# Patient Record
Sex: Female | Born: 1985 | Race: White | Hispanic: Yes | Marital: Single | Smoking: Former smoker
Health system: Southern US, Community
[De-identification: ages and names within clinical notes are randomized; demographics above are authoritative.]

## PROBLEM LIST (undated history)

## (undated) DIAGNOSIS — F319 Bipolar disorder, unspecified: Secondary | ICD-10-CM

## (undated) DIAGNOSIS — F419 Anxiety disorder, unspecified: Secondary | ICD-10-CM

---
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
## 2003-08-26 ENCOUNTER — Encounter: Admission: RE | Admit: 2003-08-26 | Discharge: 2003-08-26 | Payer: Self-pay | Admitting: Obstetrics and Gynecology

## 2003-12-23 ENCOUNTER — Encounter: Admission: RE | Admit: 2003-12-23 | Discharge: 2003-12-23 | Payer: Self-pay | Admitting: Obstetrics and Gynecology

## 2004-11-23 ENCOUNTER — Inpatient Hospital Stay (HOSPITAL_COMMUNITY): Admission: AD | Admit: 2004-11-23 | Discharge: 2004-11-23 | Payer: Self-pay | Admitting: Obstetrics & Gynecology

## 2005-08-07 ENCOUNTER — Ambulatory Visit: Payer: Self-pay | Admitting: Family Medicine

## 2005-08-30 ENCOUNTER — Ambulatory Visit: Payer: Self-pay | Admitting: Family Medicine

## 2005-08-30 ENCOUNTER — Encounter (INDEPENDENT_AMBULATORY_CARE_PROVIDER_SITE_OTHER): Payer: Self-pay | Admitting: Family Medicine

## 2006-04-25 ENCOUNTER — Ambulatory Visit: Payer: Self-pay | Admitting: Family Medicine

## 2006-12-12 ENCOUNTER — Encounter (INDEPENDENT_AMBULATORY_CARE_PROVIDER_SITE_OTHER): Payer: Self-pay | Admitting: Family Medicine

## 2006-12-12 ENCOUNTER — Ambulatory Visit: Payer: Self-pay | Admitting: Family Medicine

## 2007-04-02 ENCOUNTER — Encounter (INDEPENDENT_AMBULATORY_CARE_PROVIDER_SITE_OTHER): Payer: Self-pay | Admitting: *Deleted

## 2007-09-04 ENCOUNTER — Ambulatory Visit: Payer: Self-pay | Admitting: Family Medicine

## 2007-09-04 ENCOUNTER — Ambulatory Visit: Payer: Self-pay | Admitting: *Deleted

## 2007-09-04 LAB — CONVERTED CEMR LAB
Calcium: 9.8 mg/dL (ref 8.4–10.5)
Creatinine, Ser: 0.54 mg/dL (ref 0.40–1.20)
Free T4: 1.26 ng/dL (ref 0.89–1.80)
HCT: 42.1 % (ref 36.0–46.0)
MCHC: 31.8 g/dL (ref 30.0–36.0)
MCV: 94.8 fL (ref 78.0–100.0)
Monocytes Relative: 9 % (ref 3–12)
Neutro Abs: 2.4 10*3/uL (ref 1.7–7.7)
Neutrophils Relative %: 45 % (ref 43–77)
Potassium: 4.4 meq/L (ref 3.5–5.3)
RDW: 13.4 % (ref 11.5–15.5)
Sodium: 141 meq/L (ref 135–145)
TSH: 1.862 microintl units/mL (ref 0.350–5.50)

## 2007-12-15 ENCOUNTER — Ambulatory Visit: Payer: Self-pay | Admitting: Family Medicine

## 2007-12-15 ENCOUNTER — Encounter (INDEPENDENT_AMBULATORY_CARE_PROVIDER_SITE_OTHER): Payer: Self-pay | Admitting: Family Medicine

## 2007-12-15 LAB — CONVERTED CEMR LAB
Chlamydia, DNA Probe: NEGATIVE
GC Probe Amp, Genital: NEGATIVE

## 2008-03-19 ENCOUNTER — Ambulatory Visit: Payer: Self-pay | Admitting: Family Medicine

## 2008-04-26 ENCOUNTER — Ambulatory Visit: Payer: Self-pay | Admitting: Internal Medicine

## 2008-06-18 ENCOUNTER — Inpatient Hospital Stay (HOSPITAL_COMMUNITY): Admission: AD | Admit: 2008-06-18 | Discharge: 2008-06-19 | Payer: Self-pay | Admitting: Obstetrics & Gynecology

## 2008-08-20 ENCOUNTER — Inpatient Hospital Stay (HOSPITAL_COMMUNITY): Admission: AD | Admit: 2008-08-20 | Discharge: 2008-08-20 | Payer: Self-pay | Admitting: Obstetrics & Gynecology

## 2008-08-20 ENCOUNTER — Ambulatory Visit: Payer: Self-pay | Admitting: Physician Assistant

## 2008-09-16 ENCOUNTER — Ambulatory Visit (HOSPITAL_COMMUNITY): Admission: RE | Admit: 2008-09-16 | Discharge: 2008-09-16 | Payer: Self-pay | Admitting: Obstetrics & Gynecology

## 2008-11-09 ENCOUNTER — Ambulatory Visit: Payer: Self-pay | Admitting: Family Medicine

## 2008-11-09 ENCOUNTER — Inpatient Hospital Stay (HOSPITAL_COMMUNITY): Admission: AD | Admit: 2008-11-09 | Discharge: 2008-11-12 | Payer: Self-pay | Admitting: Obstetrics & Gynecology

## 2009-12-20 IMAGING — US US OB FOLLOW-UP
1 series · 14 of 28 positions shown · non-contrast
Comparison: none

OBSTETRICAL ULTRASOUND:
 This ultrasound exam was performed in the [HOSPITAL] Ultrasound Department.  The OB US report was generated in the AS system, and faxed to the ordering physician.  This report is also available in [REDACTED] PACS.

[Series 1: us ob follow up · 14 of 40 slices shown]
[im 2/40]
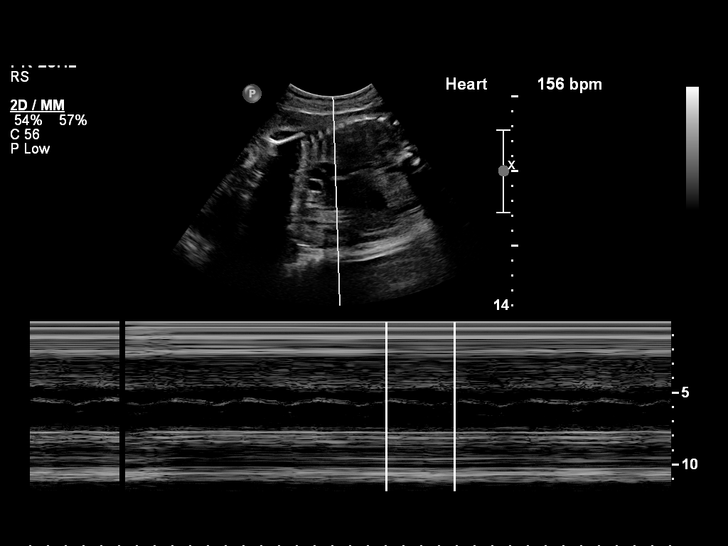
[im 5/40]
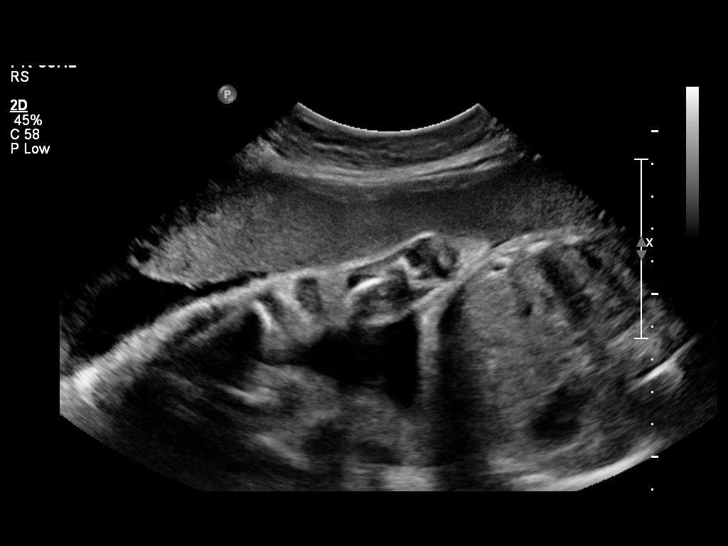
[im 8/40]
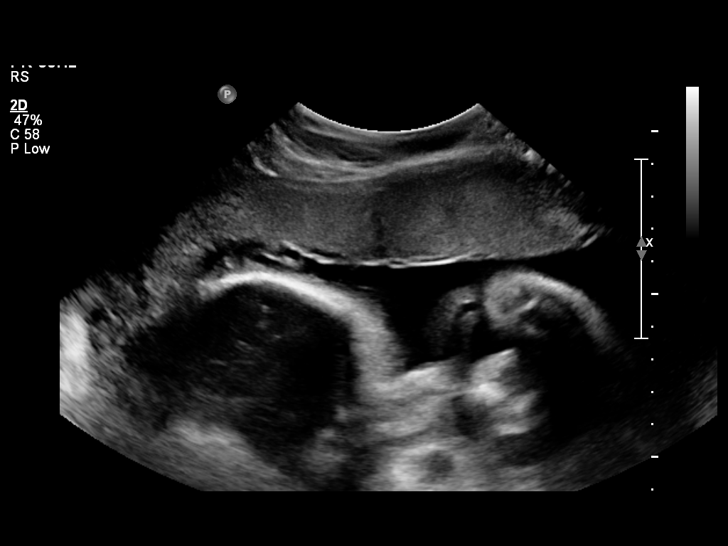
[im 11/40]
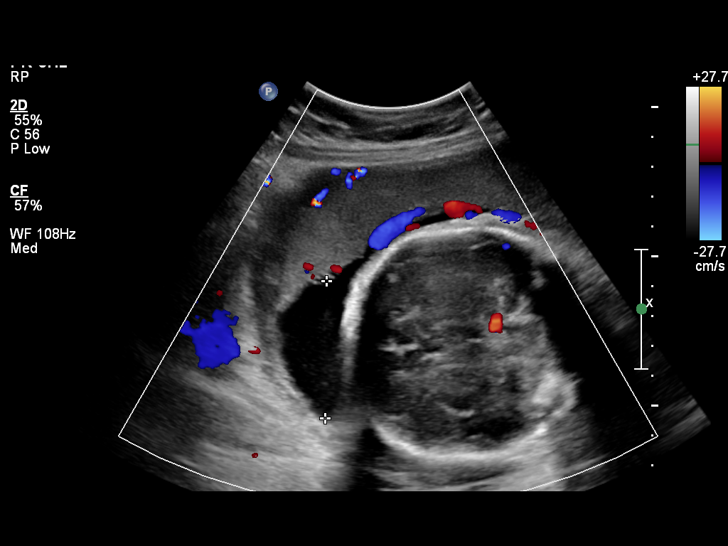
[im 14/40]
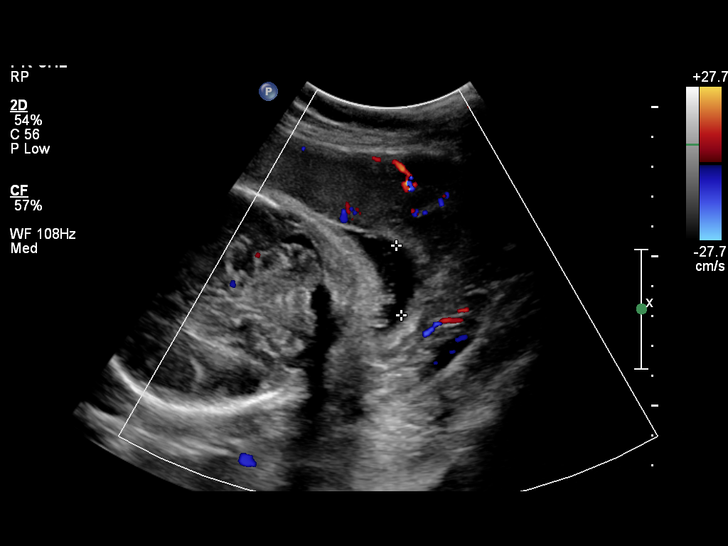
[im 16/40]
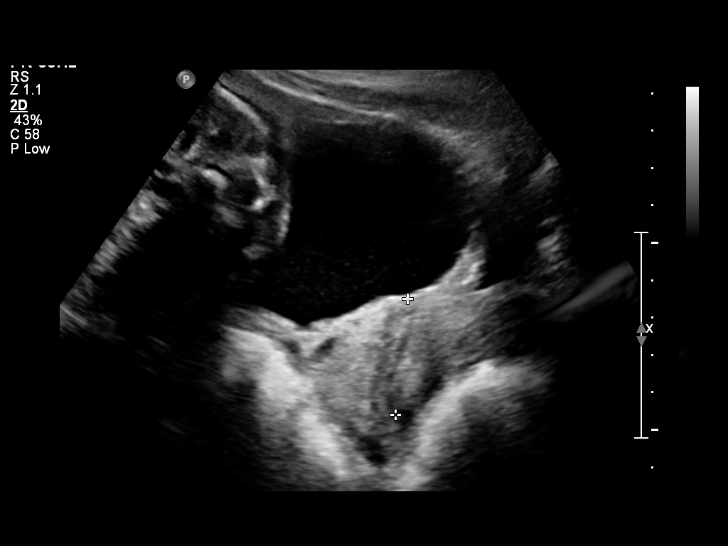
[im 19/40]
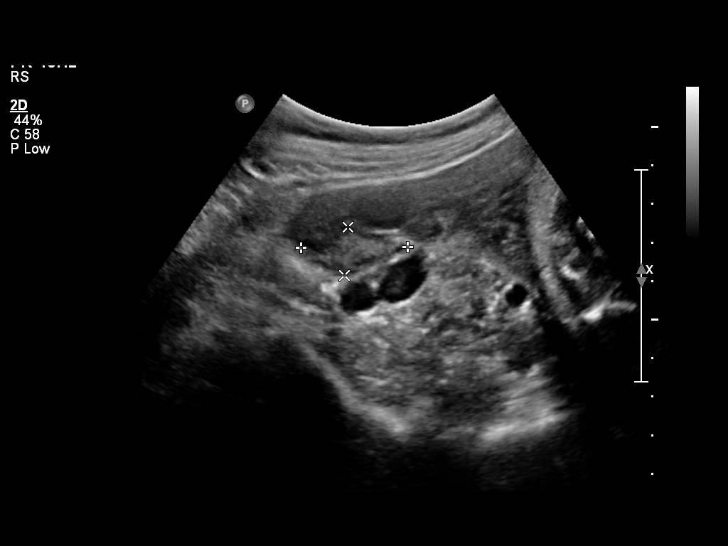
[im 22/40]
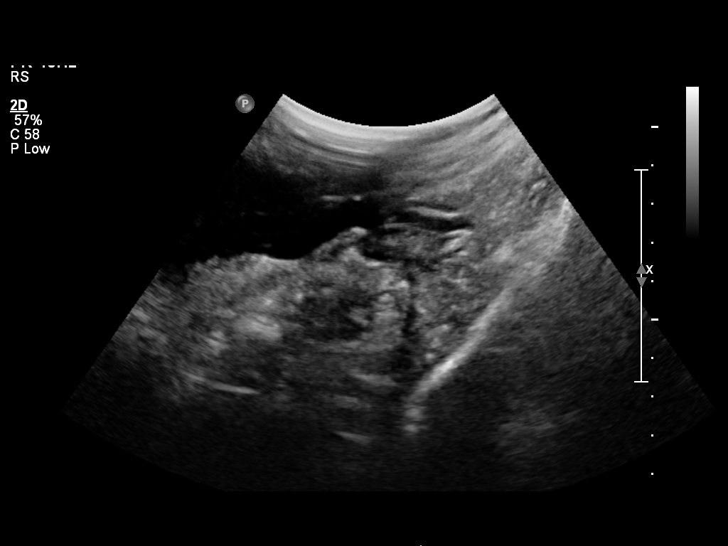
[im 25/40]
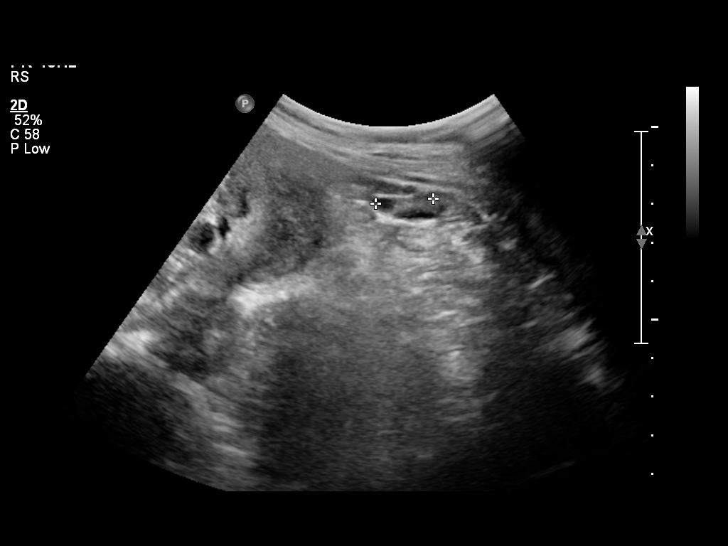
[im 28/40]
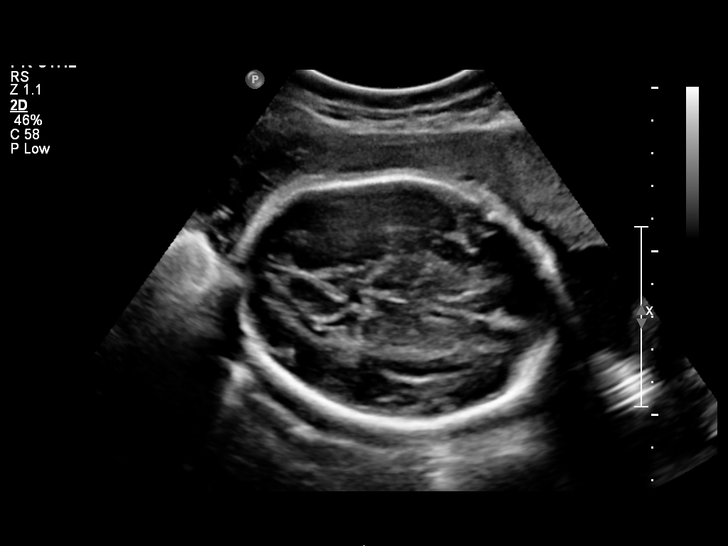
[im 31/40]
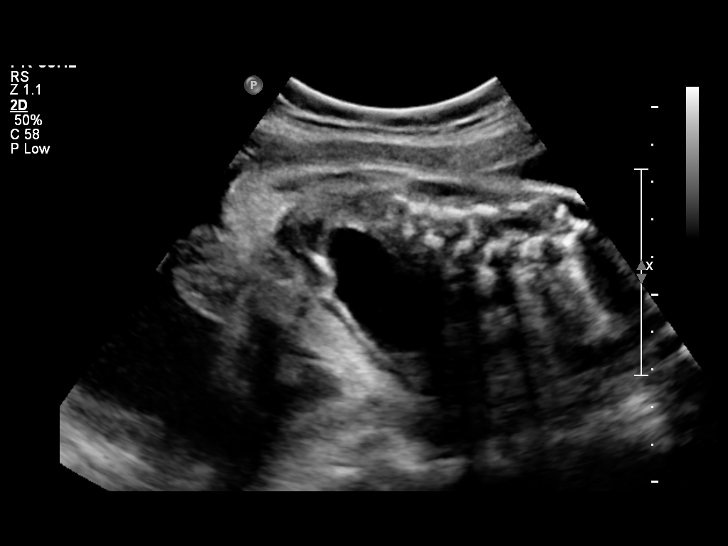
[im 34/40]
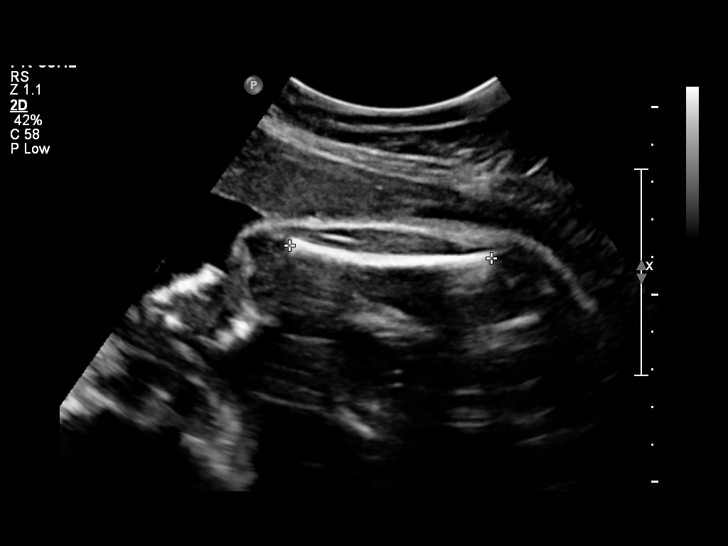
[im 37/40]
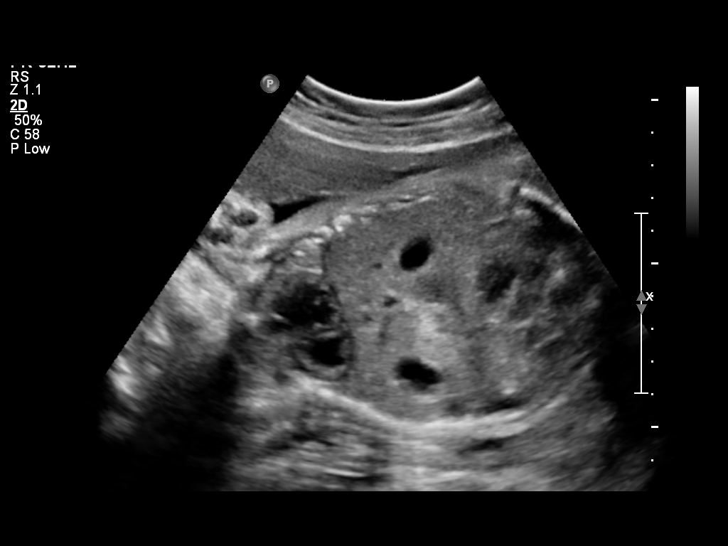
[im 40/40]
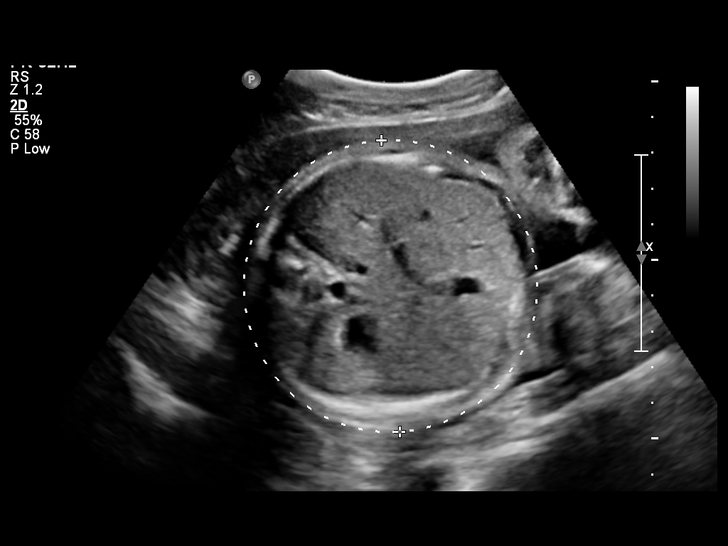

[14 of 28 positions shown; findings below may reference images not displayed]

IMPRESSION: See AS Obstetric US report.

## 2010-10-25 LAB — URINE MICROSCOPIC-ADD ON

## 2010-10-25 LAB — COMPREHENSIVE METABOLIC PANEL
ALT: 30 U/L (ref 0–35)
ALT: 34 U/L (ref 0–35)
AST: 38 U/L — ABNORMAL HIGH (ref 0–37)
Albumin: 2.5 g/dL — ABNORMAL LOW (ref 3.5–5.2)
Alkaline Phosphatase: 250 U/L — ABNORMAL HIGH (ref 39–117)
BUN: 13 mg/dL (ref 6–23)
CO2: 29 mEq/L (ref 19–32)
Calcium: 6.5 mg/dL — ABNORMAL LOW (ref 8.4–10.5)
Chloride: 105 mEq/L (ref 96–112)
GFR calc Af Amer: >60 mL/min (ref >60)
GFR calc non Af Amer: >60 mL/min (ref >60)
Glucose, Bld: 82 mg/dL (ref 70–99)
Potassium: 4.1 mEq/L (ref 3.5–5.1)
Sodium: 137 mEq/L (ref 135–145)
Sodium: 137 mEq/L (ref 135–145)
Total Bilirubin: 0.4 mg/dL (ref 0.3–1.2)
Total Protein: 5.7 g/dL — ABNORMAL LOW (ref 6.0–8.3)

## 2010-10-25 LAB — CBC
HCT: 33 % — ABNORMAL LOW (ref 36.0–46.0)
Hemoglobin: 11.5 g/dL — ABNORMAL LOW (ref 12.0–15.0)
Hemoglobin: 13.3 g/dL (ref 12.0–15.0)
MCHC: 34.7 g/dL (ref 30.0–36.0)
MCV: 93.4 fL (ref 78.0–100.0)
Platelets: 180 10*3/uL (ref 150–400)
RBC: 3.54 MIL/uL — ABNORMAL LOW (ref 3.87–5.11)
RBC: 4.11 MIL/uL (ref 3.87–5.11)
RDW: 12.7 % (ref 11.5–15.5)
RDW: 13.3 % (ref 11.5–15.5)
WBC: 12.9 10*3/uL — ABNORMAL HIGH (ref 4.0–10.5)
WBC: 6.9 10*3/uL (ref 4.0–10.5)

## 2010-10-25 LAB — URINALYSIS, ROUTINE W REFLEX MICROSCOPIC
Glucose, UA: NEGATIVE mg/dL
Ketones, ur: NEGATIVE mg/dL
pH: 5.5 (ref 5.0–8.0)

## 2010-12-01 NOTE — Op Note (Signed)
Teresa Kane, Teresa Kane                            ACCOUNT NO.:  [id number]   MEDICAL RECORD NO.:  [id number]                   PATIENT TYPE:  AMB   LOCATION:  [facility]                                 FACILITY:  [facility]   PHYSICIAN:  Pershing Cox, M.D.            DATE OF BIRTH:  03-18-1986   DATE OF PROCEDURE:  12/19/2002  DATE OF DISCHARGE:                                 OPERATIVE REPORT   PREOPERATIVE DIAGNOSIS:  Incomplete abortion at six to eight weeks'  gestation.   POSTOPERATIVE DIAGNOSIS:  Incomplete abortion at six to eight weeks'  gestation.   PROCEDURE:  Suction dilatation and irrigation.   ANESTHESIA:  MAC plus Marcaine paracervical block.   SURGEON:  Pershing Cox, M.D.   INDICATIONS:  Teresa Kane presented to the emergency room this evening  complaining of several hours of severe abdominal cramping and bleeding.  She  has had a positive pregnancy test and passed tissue at home.  In the  emergency room, a quantitative beta HCG was over 51,000.  An ultrasound was  performed, which showed no intrauterine pregnancy but lots of tissue  consistent with retained products of conception, no evidence of ectopic  pregnancy.  After appropriate consent, the patient was offered the  opportunity to be discharged with follow-up or to undergo a D&E.  At her  request, we brought her to the operating room to complete this procedure  after appropriate consent.  She understood at the time of her consent that  she should return to the emergency room for any heavy bleeding or fever.   OPERATIVE FINDINGS:  The patient's uterus was eight weeks in size and firm.  There was a 9 cm endometrial cavity.  There was a moderate amount of tissue  in the os, which was fingertip dilated.   PROCEDURE:  Curley Fayette was brought to the operating room with an IV in  place.  She received a gram of Ancef on the way to the operating room.  On  the OR table, IV sedation was administered and she was then  placed in the  [place] stirrups.  Her legs were secured, and she was prepped with a solution  of Betadine.  Sterile linens were placed beneath her buttocks and to drape  her pelvis.  A bivalve speculum was inserted into the vagina.  Marcaine  0.25% was injected into the anterior cervix, which was grasped with a single-  tooth tenaculum.  Marcaine 0.25% 10 mL were injected into the pericervical  tissues, giving a total volume of 10 mL.  This was injected at the 3, 4, 7,  and 8 positions.  A uterine sound could not easily be passed because of the  tissue already in the canal.  Using a series of serial Pratt dilators, the  cervix was dilated without resistance and the sound could then be passed to  a depth of approximately 8 cm.  Using a 9 curved curette, the suction device  was attached to suction and creation of suction allowed [place] to evacuate the  endometrium by rotating the curette.  In order to complete the curettage,  about five passes were necessary and a moderate amount of tissue was  obtained.  Following the evacuation, a small sharp curette was used to  curette the uterine wall.  The suction device was reinserted, but no  additional tissue was obtained.  Bleeding was moderate but slowed at the end  of the procedure to a very small trickle.  The sound passed again to a depth  of 9 cm.  The procedure was completed.  The patient was taken to the  recovery room in excellent condition.                                              Pershing Cox, M.D.   MAJ/MEDQ  D:  12/19/2002  T:  12/20/2002  Job:  [id number]

## 2010-12-01 NOTE — Group Therapy Note (Signed)
NAMEJUNE, RODE NO.:  [id number]   MEDICAL RECORD NO.:  [id number]                   PATIENT TYPE:  OUT   LOCATION:  [facility]                           FACILITY:  [facility]   PHYSICIAN:  Argentina Donovan, MD                     DATE OF BIRTH:  1986/02/01   DATE OF SERVICE:  08/26/2003                                    CLINIC NOTE   CHIEF COMPLAINT:  Follow-up abnormal colposcopy.   SUBJECTIVE:  Teresa Kane is a 25 year old G1 P0-0-1-0 who presents with an  abnormal colposcopy.  She had a Pap smear done in September 2004 that showed  low-grade SIL.  A colposcopy was done at the health department which showed  condyloma with a negative ECC in November 2004.  She is here today for  further treatment.  She currently has no complaints.  A urine pregnancy test  is negative.  Her last menstrual period was August 02, 2003 and she takes  Ortho Evra for contraception.   OBJECTIVE:  Vital signs noted.  In general she is a pleasant female in no  acute distress.  External female genitalia reveal normal genitalia, normal  urethra, normal vaginal walls.  Cervix shows no gross lesions.   PROCEDURE:  1. Thin prep was done for HPV testing.  2. Cryotherapy was done.  The cryoprobe was placed for 3 minutes, allowed to     thaw for 5 minutes, and redone for another 3 minutes.  The patient     tolerated the procedure well with no complications.   ASSESSMENT AND PLAN:  1. Low-grade squamous intraepithelial lesion and condyloma.  HPV testing has     been done today.  The patient has had cryotherapy today.  2. She is to return in 4 months for a follow-up Pap smear.     Maurice March, MD                          Argentina Donovan, MD    LC/MEDQ  D:  08/26/2003  T:  08/27/2003  Job:  [id number]

## 2010-12-01 NOTE — Group Therapy Note (Signed)
Teresa Kane, Teresa Kane NO.:  [id number]   MEDICAL RECORD NO.:  [id number]                   PATIENT TYPE:  OUT   LOCATION:  [facility]                           FACILITY:  [facility]   PHYSICIAN:  Argentina Donovan, MD                     DATE OF BIRTH:  August 24, 1985   DATE OF SERVICE:  12/23/2003                                    CLINIC NOTE   REASON FOR VISIT:  The patient is a 25 year old gravida 1 para 0-0-1-0  English-speaking Hispanic young lady who had a Pap smear done in September  2004 that showed low-grade SIL.  Colposcopies done at the health department  showed a condylomata with negative ECC.  She was then seen in our clinic in  February 2004.  HPV typing was done and no high-risk HPV was found.  She  returns today for Pap smear which was done without incident.  The patient  had cryotherapy done for 3 minutes with a 5-minute thaw and a 3-minute  refreeze back in February.  If the patient shows low-grade SIL and maintains  a negative high-risk HPV I would switch her visits to once a year.   IMPRESSION:  Low-grade squamous intraepithelial lesion in the past, post  treatment.                                               Argentina Donovan, MD    PR/MEDQ  D:  12/23/2003  T:  12/23/2003  Job:  [id number]

## 2011-04-19 LAB — CBC
HCT: 33.1 % — ABNORMAL LOW (ref 36.0–46.0)
MCV: 92.3 fL (ref 78.0–100.0)
Platelets: 231 10*3/uL (ref 150–400)
RBC: 3.59 MIL/uL — ABNORMAL LOW (ref 3.87–5.11)
WBC: 8 10*3/uL (ref 4.0–10.5)

## 2011-04-19 LAB — WET PREP, GENITAL: Yeast Wet Prep HPF POC: NONE SEEN

## 2011-04-19 LAB — POCT PREGNANCY, URINE: Preg Test, Ur: POSITIVE

## 2012-01-29 ENCOUNTER — Encounter (HOSPITAL_COMMUNITY): Payer: Self-pay | Admitting: *Deleted

## 2012-01-29 ENCOUNTER — Emergency Department (HOSPITAL_COMMUNITY)
Admission: EM | Admit: 2012-01-29 | Discharge: 2012-01-29 | Disposition: A | Discharge status: Home / Self Care | Payer: Self-pay | Attending: Emergency Medicine | Admitting: Emergency Medicine

## 2012-01-29 DIAGNOSIS — R51 Headache: Secondary | ICD-10-CM | POA: Insufficient documentation

## 2012-01-29 LAB — URINALYSIS, ROUTINE W REFLEX MICROSCOPIC
Glucose, UA: 500 mg/dL — AB
Specific Gravity, Urine: 1.022 (ref 1.005–1.030)
pH: 6 (ref 5.0–8.0)

## 2012-01-29 LAB — URINE MICROSCOPIC-ADD ON

## 2012-01-29 LAB — POCT I-STAT, CHEM 8
BUN: 9 mg/dL (ref 6–23)
Chloride: 103 mEq/L (ref 96–112)
Sodium: 139 mEq/L (ref 135–145)

## 2012-01-29 LAB — GLUCOSE, CAPILLARY: Glucose-Capillary: 118 mg/dL — ABNORMAL HIGH (ref 70–99)

## 2012-01-29 MED ORDER — DIPHENHYDRAMINE HCL 50 MG/ML IJ SOLN
25.0000 mg | Freq: Once | INTRAMUSCULAR | Status: AC
  Administered 2012-01-29: 25 mg via INTRAVENOUS
  Filled 2012-01-29: qty 1

## 2012-01-29 MED ORDER — DEXAMETHASONE SODIUM PHOSPHATE 10 MG/ML IJ SOLN
10.0000 mg | Freq: Once | INTRAMUSCULAR | Status: AC
  Administered 2012-01-29: 10 mg via INTRAVENOUS
  Filled 2012-01-29: qty 1

## 2012-01-29 MED ORDER — KETOROLAC TROMETHAMINE 30 MG/ML IJ SOLN
30.0000 mg | Freq: Once | INTRAMUSCULAR | Status: AC
  Administered 2012-01-29: 30 mg via INTRAVENOUS
  Filled 2012-01-29: qty 1

## 2012-01-29 MED ORDER — SODIUM CHLORIDE 0.9 % IV BOLUS (SEPSIS)
1000.0000 mL | Freq: Once | INTRAVENOUS | Status: AC
  Administered 2012-01-29: 1000 mL via INTRAVENOUS

## 2012-01-29 MED ORDER — METOCLOPRAMIDE HCL 5 MG/ML IJ SOLN
10.0000 mg | Freq: Once | INTRAMUSCULAR | Status: AC
  Administered 2012-01-29: 10 mg via INTRAVENOUS
  Filled 2012-01-29: qty 2

## 2012-01-29 MED ORDER — ACETAMINOPHEN 500 MG PO TABS
1000.0000 mg | ORAL_TABLET | Freq: Once | ORAL | Status: AC
  Administered 2012-01-29: 1000 mg via ORAL
  Filled 2012-01-29 (×2): qty 2

## 2012-01-29 NOTE — ED Provider Notes (Signed)
History     CSN: [id number]  Arrival date & time 01/29/12  1533   First MD Initiated Contact with Patient 01/29/12 1752      Chief Complaint  Patient presents with  . Headache    (Consider location/radiation/quality/duration/timing/severity/associated sxs/prior treatment) Patient is a 26 y.o. female presenting with headaches. The history is provided by the patient and a relative.  Headache  This is a recurrent problem. The current episode started 3 to 5 hours ago. The problem occurs constantly. The problem has not changed since onset.The headache is associated with an unknown factor. The pain is located in the frontal region. The quality of the pain is described as throbbing. The pain is severe. The pain does not radiate. Associated symptoms include nausea. Pertinent negatives include no anorexia, no fever, no malaise/fatigue, no near-syncope, no palpitations, no syncope, no shortness of breath and no vomiting. She has tried nothing for the symptoms.    History reviewed. No pertinent past medical history.  History reviewed. No pertinent past surgical history.  No family history on file.  History  Substance Use Topics  . Smoking status: Former Games developer  . Smokeless tobacco: Not on file  . Alcohol Use: No    OB History    Grav Para Term Preterm Abortions TAB SAB Ect Mult Living                  Review of Systems  Constitutional: Negative for fever, chills and malaise/fatigue.  HENT: Negative for neck pain and neck stiffness.   Respiratory: Negative for cough and shortness of breath.   Cardiovascular: Negative for chest pain, palpitations, syncope and near-syncope.  Gastrointestinal: Positive for nausea. Negative for vomiting, abdominal pain and anorexia.  Musculoskeletal: Negative for back pain.  Skin: Negative for color change and rash.  Neurological: Positive for headaches. Negative for syncope, speech difficulty, weakness, light-headedness and numbness. Dizziness: "being  in a fog;" no vertiginous symptoms.  All other systems reviewed and are negative.    Allergies  Review of patient's allergies indicates no known allergies.  Home Medications   Current Outpatient Rx  Name Route Sig Dispense Refill  . ADULT MULTIVITAMIN W/MINERALS CH Oral Take 1 tablet by mouth daily.      BP 126/79  Pulse 89  Temp 98.6 F (37 C) (Oral)  Resp 16  SpO2 100%  Physical Exam  Nursing note and vitals reviewed. Constitutional: She is oriented to person, place, and time. She appears well-developed and well-nourished.  HENT:  Head: Normocephalic and atraumatic.  Eyes: EOM are normal. Pupils are equal, round, and reactive to light.  Neck: Full passive range of motion without pain. No Brudzinski's sign and no Kernig's sign noted.  Cardiovascular: Normal rate, regular rhythm, normal heart sounds and intact distal pulses.   Pulmonary/Chest: Effort normal and breath sounds normal. No respiratory distress.  Abdominal: Soft. She exhibits no distension. There is no tenderness.  Neurological: She is alert and oriented to person, place, and time. She has normal strength. No cranial nerve deficit or sensory deficit. Coordination normal. GCS eye subscore is 4. GCS verbal subscore is 5. GCS motor subscore is 6.  Reflex Scores:      Bicep reflexes are 2+ on the right side and 2+ on the left side.      Patellar reflexes are 2+ on the right side and 2+ on the left side. Skin: Skin is warm and dry.  Psychiatric: She has a normal mood and affect.    ED Course  Procedures (including critical care time)  Labs Reviewed  URINALYSIS, ROUTINE W REFLEX MICROSCOPIC - Abnormal; Notable for the following:    Glucose, UA 500 (*)     Hgb urine dipstick TRACE (*)     All other components within normal limits  GLUCOSE, CAPILLARY - Abnormal; Notable for the following:    Glucose-Capillary 118 (*)     All other components within normal limits  POCT I-STAT, CHEM 8 - Abnormal; Notable for the  following:    Glucose, Bld 114 (*)     All other components within normal limits  URINE MICROSCOPIC-ADD ON   No results found.   1. Headache       MDM  This is a 26 year old female presents today with a headache. The patient states that shortly after she arrived at work she had a diffuse frontal headache that proceeded to get worse, accompanied by mild nausea feelings of being in a fog and photophobia. This progressively worsened throughout the day and she was unable to stay at work any further, so her sister brought her to the ED for evaluation. The patient states that she has a history of similar type headaches before, about once a week, however they have not lasted this long before, and typically go away in about an hour on their own; they are accompanied by some nausea and mild photophobia. She denies any head injuries, any focal neuro deficits, any recent infectious symptoms and states that her mother and multiple sisters have migraine headaches; her sister states she has to take medication daily to prevent severe headaches. The patient appears comfortable at this time, and neuro exam is unremarkable. With similar headaches previously, lack of neurologic complaints or findings on exam, do not feel that the patient requires imaging at this point in time.  Patient with significant improvement of her headache. She does endorse mild residual headache, and she reports resolution of feeling like she is in a fog. Discussed with the patient followup with a regular Dr. for evaluation of possible need for medications to treat her relatively frequent headaches especially given her strong family history of migraines. So discussed indications for which she should return, including worsening or changing headache, development of any neurologic symptoms and the patient expresses understanding.     Theotis Burrow, MD 01/29/12 [phone]

## 2012-01-29 NOTE — ED Notes (Signed)
Pt states that she continues to feel dizzy and that the pain has not changed.

## 2012-01-29 NOTE — ED Notes (Signed)
Pt is here for a headache that started today, pt reports dizziness, photophobia, and sensitive to light.  Pt states that her head hurts so bad that she cannot think.  She has never had a headache like this.

## 2012-02-01 NOTE — ED Provider Notes (Signed)
I saw and evaluated the patient, reviewed the resident's note and I agree with the findings and plan. Pt with gradual onset headache. C/w prior headaches. No neck stiffness/rigidity.   Suzi Roots, MD 02/01/12 [phone]

## 2012-02-02 ENCOUNTER — Encounter (HOSPITAL_COMMUNITY): Payer: Self-pay | Admitting: Emergency Medicine

## 2012-02-02 ENCOUNTER — Emergency Department (HOSPITAL_COMMUNITY)
Admission: EM | Admit: 2012-02-02 | Discharge: 2012-02-03 | Disposition: A | Discharge status: Home / Self Care | Payer: Self-pay | Attending: Emergency Medicine | Admitting: Emergency Medicine

## 2012-02-02 ENCOUNTER — Emergency Department (HOSPITAL_COMMUNITY): Payer: Self-pay

## 2012-02-02 DIAGNOSIS — F411 Generalized anxiety disorder: Secondary | ICD-10-CM | POA: Insufficient documentation

## 2012-02-02 DIAGNOSIS — Z87891 Personal history of nicotine dependence: Secondary | ICD-10-CM | POA: Insufficient documentation

## 2012-02-02 DIAGNOSIS — R4182 Altered mental status, unspecified: Secondary | ICD-10-CM | POA: Insufficient documentation

## 2012-02-02 DIAGNOSIS — F419 Anxiety disorder, unspecified: Secondary | ICD-10-CM

## 2012-02-02 LAB — CBC WITH DIFFERENTIAL/PLATELET
Eosinophils Relative: 2 % (ref 0–5)
HCT: 43.4 % (ref 36.0–46.0)
Hemoglobin: 14.9 g/dL (ref 12.0–15.0)
Lymphocytes Relative: 28 % (ref 12–46)
Lymphs Abs: 2.8 10*3/uL (ref 0.7–4.0)
MCV: 89.9 fL (ref 78.0–100.0)
Monocytes Absolute: 0.7 10*3/uL (ref 0.1–1.0)
Monocytes Relative: 7 % (ref 3–12)
Neutro Abs: 6.2 10*3/uL (ref 1.7–7.7)
RBC: 4.83 MIL/uL (ref 3.87–5.11)
WBC: 9.9 10*3/uL (ref 4.0–10.5)

## 2012-02-02 LAB — GLUCOSE, CAPILLARY: Glucose-Capillary: 104 mg/dL — ABNORMAL HIGH (ref 70–99)

## 2012-02-02 LAB — COMPREHENSIVE METABOLIC PANEL
AST: 18 U/L (ref 0–37)
CO2: 28 mEq/L (ref 19–32)
Calcium: 9.6 mg/dL (ref 8.4–10.5)
Chloride: 99 mEq/L (ref 96–112)
Creatinine, Ser: 0.47 mg/dL — ABNORMAL LOW (ref 0.50–1.10)
GFR calc Af Amer: >90 mL/min (ref >90)
GFR calc non Af Amer: >90 mL/min (ref >90)
Glucose, Bld: 102 mg/dL — ABNORMAL HIGH (ref 70–99)
Total Bilirubin: 0.2 mg/dL — ABNORMAL LOW (ref 0.3–1.2)

## 2012-02-02 LAB — RAPID URINE DRUG SCREEN, HOSP PERFORMED
Amphetamines: NOT DETECTED
Opiates: NOT DETECTED

## 2012-02-02 LAB — ETHANOL: Alcohol, Ethyl (B): <11 mg/dL (ref 0–11)

## 2012-02-02 MED ORDER — POTASSIUM CHLORIDE 20 MEQ/15ML (10%) PO LIQD
40.0000 meq | Freq: Once | ORAL | Status: AC
  Administered 2012-02-02: 40 meq via ORAL
  Filled 2012-02-02: qty 30

## 2012-02-02 MED ORDER — SODIUM CHLORIDE 0.9 % IJ SOLN: 3.0000 mL | INTRAMUSCULAR | Status: DC

## 2012-02-02 NOTE — BH Assessment (Addendum)
Assessment Note   Teresa Kane is an 26 y.o. female. Pt brought to [facility] by sister and friend with altered mental status.  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  Pt came to [facility] with migraine that same day.  Since then, pt has been in bed, not working, not caring for son, crying a lot.  Sister and friend describe a "delayed reaction" when they talk to her, sometimes she seems confused, tonight at [facility] she forgot why she was here several times.  Pt main complaint is that she is unable to sleep, even with Remus Loffler that she was given during previous ER visit.  Pt responded to some questions during assessment, short answers, sister and friend provided much info.  Pt denies SI/HI.  Pt denies any AV hallucinations, although friend reports she has been "talking to herself" some.  Pt does have a sister with schizophrenia.  Pt also has had a recent divorce which has been stressful.  Axis I: Psychotic disorder nos Axis II: Deferred Axis III: History reviewed. No pertinent past medical history. Axis IV: problems with primary support group Axis V: 21-30 behavior considerably influenced by delusions or hallucinations OR serious impairment in judgment, communication OR inability to function in almost all areas  Past Medical History: History reviewed. No pertinent past medical history.  History reviewed. No pertinent past surgical history.  Family History:  Family History  Problem Relation Age of Onset  . Schizophrenia Mother   . Schizophrenia Other     Social History:  reports that she has quit smoking. She does not have any smokeless tobacco history on file. She reports that she does not drink alcohol or use illicit drugs.  Additional Social History:  Alcohol / Drug Use Pain Medications: Pt and sister deny any drug or alcohol use History of alcohol / drug use?: No history  of alcohol / drug abuse  CIWA: CIWA-Ar BP: 121/98 mmHg Pulse Rate: 89  COWS:    Allergies: No Known Allergies  Home Medications:  (Not in a hospital admission)  OB/GYN Status:  Patient's last menstrual period was 01/14/2012.  General Assessment Data Location of Assessment: [facility] ED ACT Assessment: Yes Living Arrangements: Parent;Children;Other relatives Can pt return to current living arrangement?: Yes     Risk to self Suicidal Ideation: No Suicidal Intent: No Is patient at risk for suicide?: No Suicidal Plan?: No Access to Means: No What has been your use of drugs/alcohol within the last 12 months?: pt denies use Previous Attempts/Gestures: No Intentional Self Injurious Behavior: None Family Suicide History: No Recent stressful life event(s): Other (Comment) (none noted) Persecutory voices/beliefs?: No Depression: No Substance abuse history and/or treatment for substance abuse?: No Suicide prevention information given to non-admitted patients: Not applicable  Risk to Others Homicidal Ideation: No Thoughts of Harm to Others: No Current Homicidal Intent: No Current Homicidal Plan: No Access to Homicidal Means: No History of harm to others?: No Assessment of Violence: None Noted Does patient have access to weapons?: No Criminal Charges Pending?: No Does patient have a court date: No  Psychosis Hallucinations: None noted Delusions: None noted  Mental Status Report Appear/Hygiene: Other (Comment) (casual) Eye Contact: Poor Motor Activity: Psychomotor retardation Speech: Soft Level of Consciousness: Drowsy Mood: Other (Comment) (unable to assess) Affect: Unable to Assess Anxiety Level: None Thought Processes: Relevant Judgement: Impaired Orientation: Person;Place;Time;Situation Obsessive Compulsive  Thoughts/Behaviors: None  Cognitive Functioning Concentration: Normal Memory:  (unable to asses) Appetite: Poor Weight Loss:  (unknown, pt not eating  much) Sleep: Decreased Total Hours of Sleep:  (very little sleep in last 4 days) Vegetative Symptoms: Staying in bed;Decreased grooming  ADLScreening [facility] Assessment Services) Patient's cognitive ability adequate to safely complete daily activities?: No Patient able to express need for assistance with ADLs?: No Independently performs ADLs?: Yes  Abuse/Neglect [facility]) Physical Abuse: Denies Verbal Abuse: Yes, past (Comment) Sexual Abuse: Denies  Prior Inpatient Therapy Prior Inpatient Therapy: No  Prior Outpatient Therapy Prior Outpatient Therapy: No  ADL Screening (condition at time of admission) Patient's cognitive ability adequate to safely complete daily activities?: No Patient able to express need for assistance with ADLs?: No Independently performs ADLs?: Yes       Abuse/Neglect Assessment (Assessment to be complete while patient is alone) Physical Abuse: Denies Verbal Abuse: Yes, past (Comment) Sexual Abuse: Denies Exploitation of patient/patient's resources: Denies Self-Neglect: Denies Values / Beliefs Cultural Requests During Hospitalization: None Spiritual Requests During Hospitalization: None   Advance Directives (For Healthcare) Advance Directive: Patient does not have advance directive;Patient would not like information    Additional Information 1:1 In Past 12 Months?: No CIRT Risk: No Elopement Risk: No Does patient have medical clearance?: Yes     Disposition: Spoke with Arlina Robes, NP at [facility].  Pt does not show any medical basis for the altered mental status and will be referred for inpt psych treatment.    On Site Evaluation by:   Reviewed with Physician:     Lorri Frederick 02/02/2012 11:07 PM

## 2012-02-02 NOTE — ED Notes (Signed)
Visitor informed this nurse that pt now has red bumps forming on left arm and leg. Pt also has red rash on right thigh.

## 2012-02-02 NOTE — ED Provider Notes (Signed)
History     CSN: [id number]  Arrival date & time 02/02/12  1721   First MD Initiated Contact with Patient 02/02/12 1843      Chief Complaint  Patient presents with  . Altered Mental Status    (Consider location/radiation/quality/duration/timing/severity/associated sxs/prior treatment) HPI  26 y/o female INAD accompanied by sister and friend. Patient is alert and oriented x3, slow to respond, and states that she does not know why she is here. Patient's sister and friend stated she is having mental and memory issues. Pt s seen for HA x3 days ago now resolved. Friend of the family states that she confided that she is haulcinating but she denies this now. Pt called her friend and asked her come pick her up stating that she was lost. When friend found her, she was on her bed with her son. Sister states that she is having a memory issues that wax and wane.  This change in mental status happened 5 days ago with acute onset accompanied by, HA and SOB. Pt has insomia unresolved with [place]. She is going througha a divorce. 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  Pt also started to develop a rash since she has been in the ED, states it is painless and not pruritic. Denies Neck stiffness, fever, chills, or ingestion.   History reviewed. No pertinent past medical history.  History reviewed. No pertinent past surgical history.  History reviewed. No pertinent family history.    History  Substance Use Topics  . Smoking status: Former Games developer  . Smokeless tobacco: Not on file  . Alcohol Use: No    OB History    Grav Para Term Preterm Abortions TAB SAB Ect Mult Living                  Review of Systems  Constitutional: Negative.   HENT: Negative.   Eyes: Negative.   Respiratory: Negative.   Cardiovascular: Negative.   Gastrointestinal: Negative.   Genitourinary: Negative.   Musculoskeletal: Negative.   Neurological: Negative.   All other systems reviewed and are  negative.    Allergies  Review of patient's allergies indicates no known allergies.  Home Medications   Current Outpatient Rx  Name Route Sig Dispense Refill  . ESCITALOPRAM OXALATE 10 MG PO TABS Oral Take 10 mg by mouth daily.    . ADULT MULTIVITAMIN W/MINERALS CH Oral Take 1 tablet by mouth daily.    Marland Kitchen ZOLPIDEM TARTRATE 10 MG PO TABS Oral Take 10 mg by mouth at bedtime.      BP 127/94  Pulse 91  Temp 98.8 F (37.1 C) (Oral)  Resp 16  SpO2 99%  LMP 01/14/2012  Physical Exam  Constitutional: She is oriented to person, place, and time. She appears well-developed and well-nourished. No distress.  HENT:  Head: Normocephalic and atraumatic.  Mouth/Throat: Oropharynx is clear and moist.  Eyes: Conjunctivae are normal. Pupils are equal, round, and reactive to light.  Neck: Normal range of motion. Neck supple.       No meningeal signs: kernig negative, brudzinski negative  Cardiovascular: Normal rate and regular rhythm.  Exam reveals no gallop and no friction rub.   No murmur heard. Pulmonary/Chest: Effort normal and breath sounds normal.  Abdominal: Soft. Bowel sounds are normal.  Musculoskeletal: She exhibits no edema.  Neurological: She is alert and oriented to person, place, and time.       CN 3-12 intact with 5/5 strength x4 extremities  Skin: Skin is warm  and dry. She is not diaphoretic.       Blanching macular erythematous rash to bilateral legs and torso  Psychiatric:       Flat affect, slow to respond    ED Course  Procedures (including critical care time)  Labs Reviewed  COMPREHENSIVE METABOLIC PANEL - Abnormal; Notable for the following:    Potassium 3.2 (*)     Glucose, Bld 102 (*)     Creatinine, Ser 0.47 (*)     Total Bilirubin 0.2 (*)     All other components within normal limits  GLUCOSE, CAPILLARY - Abnormal; Notable for the following:    Glucose-Capillary 104 (*)     All other components within normal limits  URINE RAPID DRUG SCREEN (HOSP  PERFORMED)  ETHANOL  CBC WITH DIFFERENTIAL  HCG, SERUM, QUALITATIVE   Ct Head Wo Contrast  02/02/2012  *RADIOLOGY REPORT*  Clinical Data: 26 year old female with headache.  Weakness.  CT HEAD WITHOUT CONTRAST  Technique:  Contiguous axial images were obtained from the base of the skull through the vertex without contrast.  Comparison: None.  Findings: Mild ethmoid sinus mucosal thickening.  Some bubbly opacity in the posterior left ethmoid.  The frontal sinuses are hypoplastic.  Other Visualized paranasal sinuses and mastoids are clear.  No acute osseous abnormality identified.  Visualized orbits and scalp soft tissues are within normal limits.  Cerebral volume is within normal limits for age.  No midline shift, ventriculomegaly, mass effect, evidence of mass lesion, intracranial hemorrhage or evidence of cortically based acute infarction.  Gray-white matter differentiation is within normal limits throughout the brain.  No suspicious intracranial vascular hyperdensity.  IMPRESSION: 1. Normal noncontrast CT appearance of the brain. 2.  Ethmoid sinus inflammatory changes, might reflect acute sinusitis in the appropriate clinical setting.  Original Report Authenticated By: Harley Hallmark, M.D.     No diagnosis found.    MDM  26 y/o female who is A and O timeswith no PMH presenting with AMS as per sister and friend with insomnia and poor memory and confusion. Pt was seen 3 days ago for headache, for headache which is now resolved. Denies stiff neck and fever. New onset blanching rash to legs and torso.   Shared visit with Attending Dr. Ranae Palms  CT and labs normal except for mild hypokalemia. Likely depression or psychiatric issue. Will consult ACT team  2300 Rash resolving. ACT team feel depression vs 1st schizophrenic break. Will transfer to pod C and request Telepsych evaluation.      Wynetta Emery, PA-C 02/03/12 [phone]

## 2012-02-02 NOTE — ED Notes (Addendum)
Per family pt has had increase stress in past 8 days. Pt has not slept for 8 days. Pt mental status changed on Tuesday. Pt sitting in wheelchair, open and closing her eyes. Pt seen here for same on Tuesday. Pt seen at a Doctor Thursday Given prescription for lexapro and ambien. Pt began medication yesterday with no change in condition. Visitors talking to pt, pt only answer yes or no. Visitors report that pt has a child a home that she is not taking care of at the present.

## 2012-02-02 NOTE — ED Notes (Signed)
Patient transported to CT 

## 2012-02-03 MED ORDER — IBUPROFEN 200 MG PO TABS: 600.0000 mg | ORAL_TABLET | Freq: Three times a day (TID) | ORAL | Status: DC | PRN

## 2012-02-03 MED ORDER — ESCITALOPRAM OXALATE 10 MG PO TABS
10.0000 mg | ORAL_TABLET | Freq: Every day | ORAL | Status: DC
Start: 2012-02-03 — End: 2012-02-03
  Filled 2012-02-03 (×2): qty 1

## 2012-02-03 MED ORDER — ONDANSETRON HCL 8 MG PO TABS: 4.0000 mg | ORAL_TABLET | Freq: Three times a day (TID) | ORAL | Status: DC | PRN

## 2012-02-03 MED ORDER — LORAZEPAM 1 MG PO TABS: 1.0000 mg | ORAL_TABLET | Freq: Three times a day (TID) | ORAL | Status: DC | PRN

## 2012-02-03 MED ORDER — ZOLPIDEM TARTRATE 5 MG PO TABS
5.0000 mg | ORAL_TABLET | Freq: Every evening | ORAL | Status: DC | PRN
  Filled 2012-02-03: qty 1

## 2012-02-03 NOTE — ED Notes (Signed)
Explained to patient that she has been accepted at [facility], awaiting bed placement.

## 2012-02-03 NOTE — ED Notes (Signed)
Pt discharged home.

## 2012-02-03 NOTE — ED Notes (Signed)
Myrlene Broker (friend) [phone]

## 2012-02-03 NOTE — ED Notes (Signed)
Dr. Fonnie Jarvis at the bedside speaking with patient, pt to be discharged home. She has no further questions at the time.

## 2012-02-03 NOTE — ED Provider Notes (Signed)
Pt denies SI/HI/hallucinations, seen and cleared for discharge by TelePsych, Pt admits anxiety/stress/insomnia but prefers discharge, do not feel IVC warranted, Pt calm pleasant and cooperative in ED, will discharge.[id number]  Hurman Horn, MD 02/03/12 [phone]

## 2012-02-03 NOTE — ED Provider Notes (Signed)
Telemetry psych consult obtained. I feel patient would benefit from psychiatric admission for psychotic break. Patient has been accepted at behavioral health  -bed is pending.  Sunnie Nielsen, MD 02/03/12 [phone]

## 2012-02-04 NOTE — ED Provider Notes (Signed)
Medical screening examination/treatment/procedure(s) were conducted as a shared visit with non-physician practitioner(s) and myself.  I personally evaluated the patient during the encounter   Loren Racer, MD 02/04/12 2308

## 2013-05-07 IMAGING — CT CT HEAD W/O CM
1 series · 16 of 30 positions shown, 20 images · non-contrast
Comparison: None.

CLINICAL DATA: 25-year-old female with headache.  Weakness.

CT HEAD WITHOUT CONTRAST
TECHNIQUE: Contiguous axial images were obtained from the base of
the skull through the vertex without contrast.

[Series 2: head routine 4.8 h37s · axial · 0.39mm/px · z∈[-86,+45]mm · 16 of 30 slices shown, 20 images]
[im 2/30  brain]
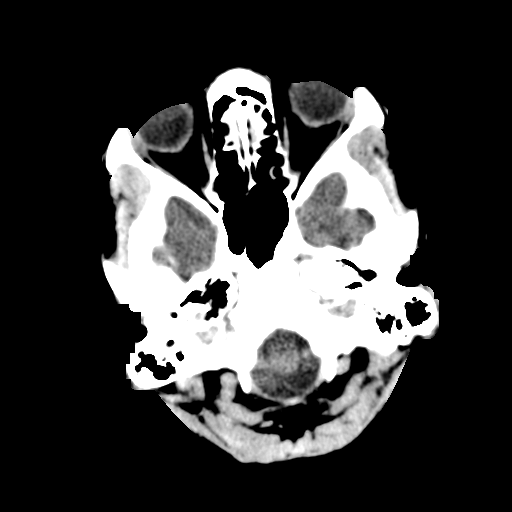
[im 2/30  bone]
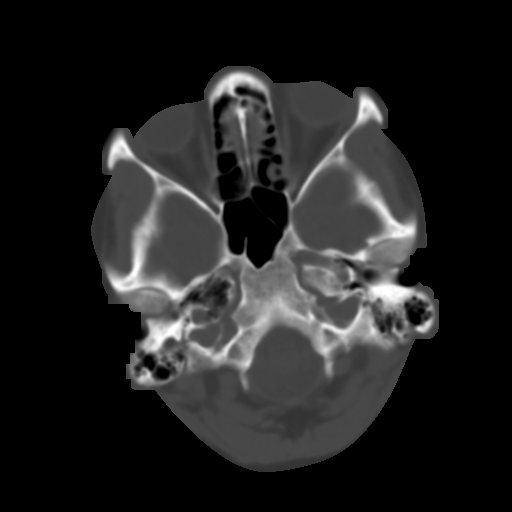
[im 4/30  brain]
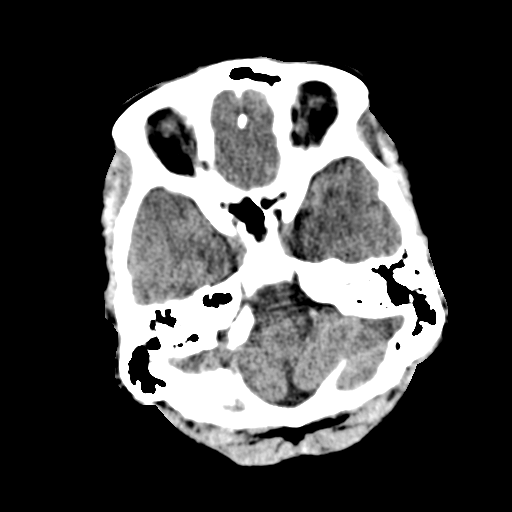
[im 6/30  brain]
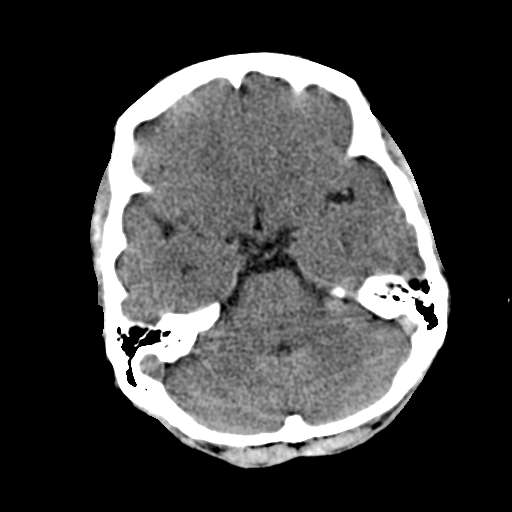
[im 8/30  brain]
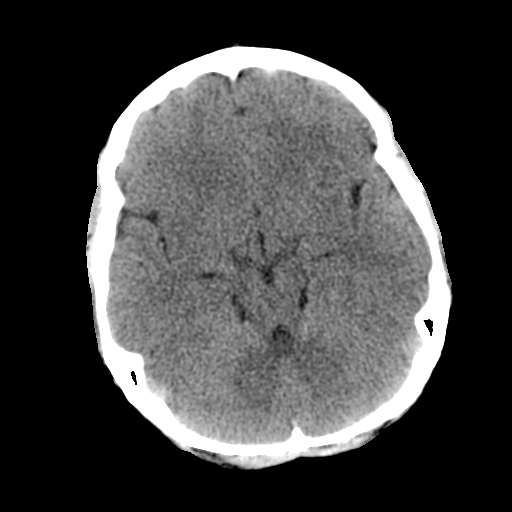
[im 9/30  brain]
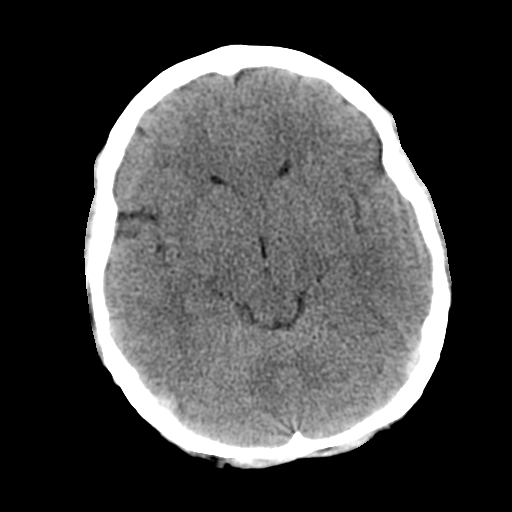
[im 9/30  bone]
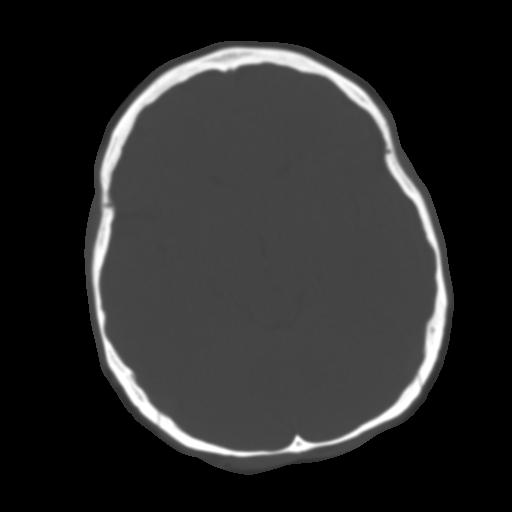
[im 11/30  brain]
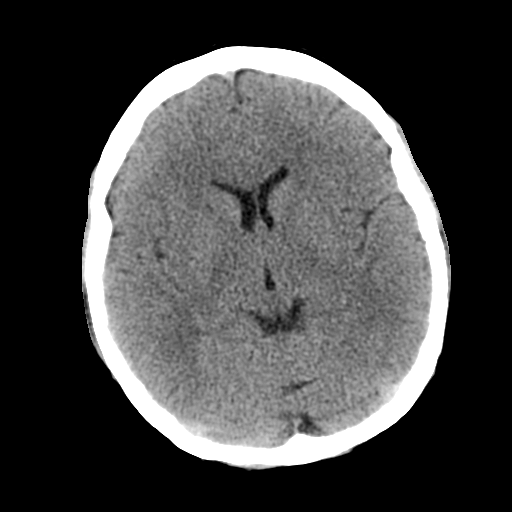
[im 13/30  brain]
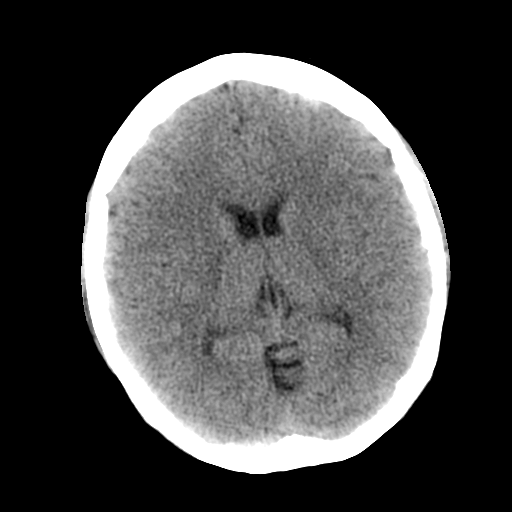
[im 15/30  brain]
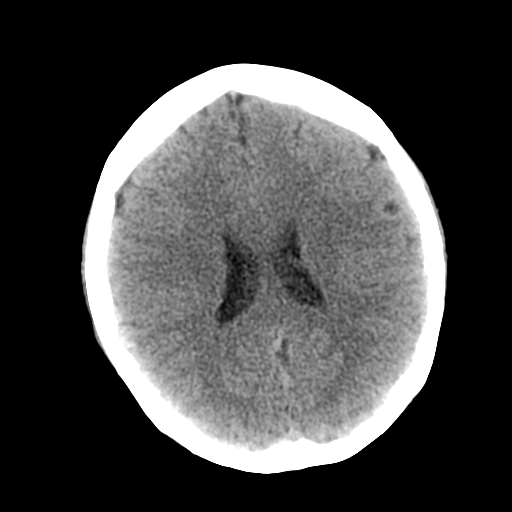
[im 16/30  brain]
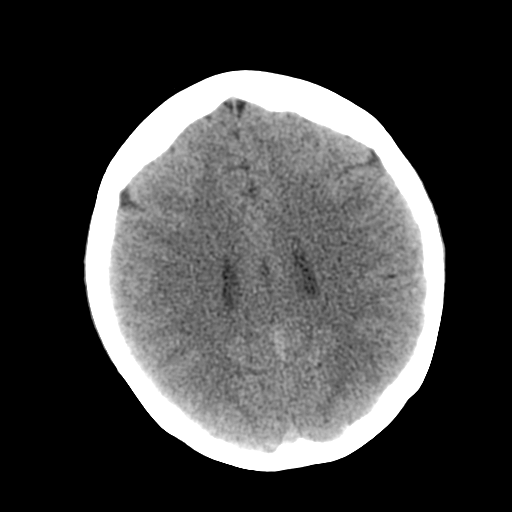
[im 16/30  bone]
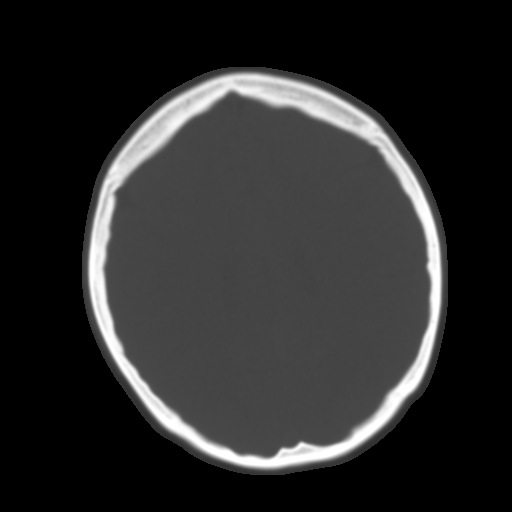
[im 18/30  brain]
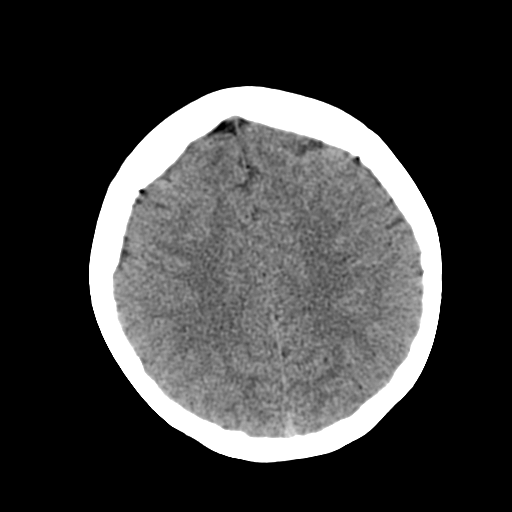
[im 20/30  brain]
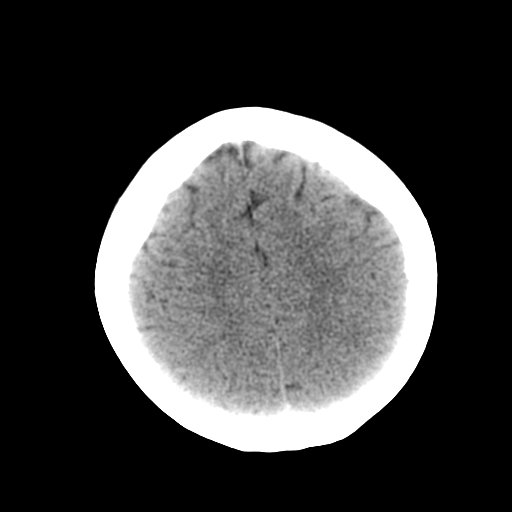
[im 22/30  brain]
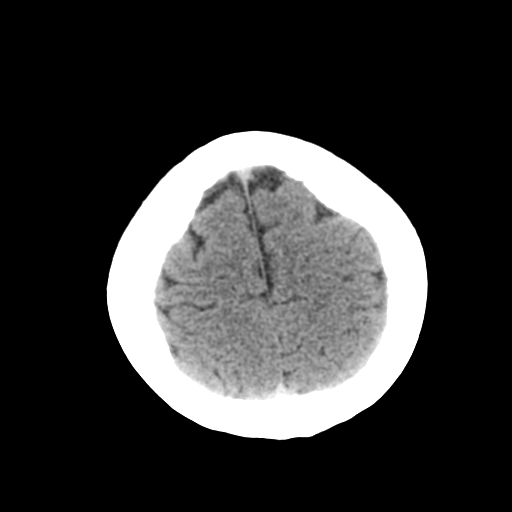
[im 23/30  brain]
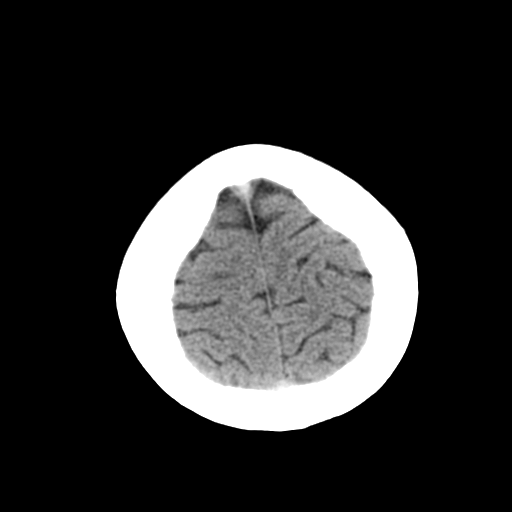
[im 23/30  bone]
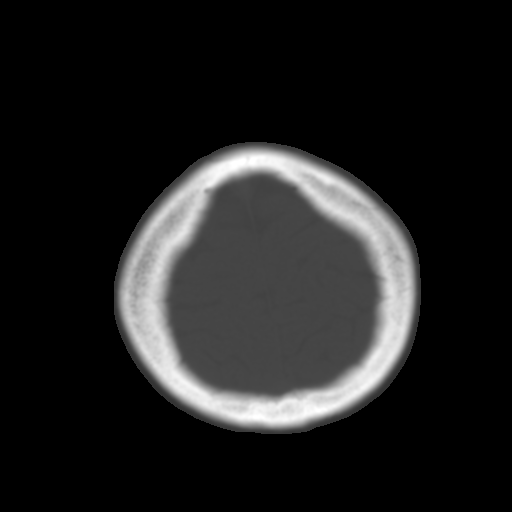
[im 25/30  brain]
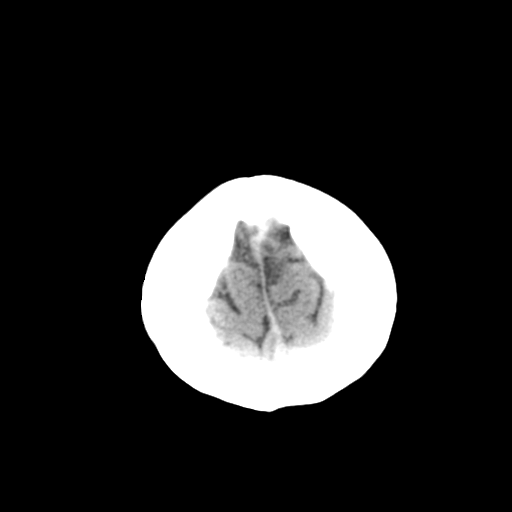
[im 27/30  brain]
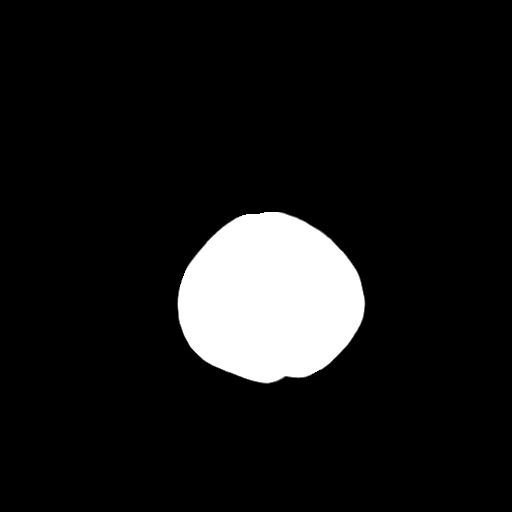
[im 29/30  brain]
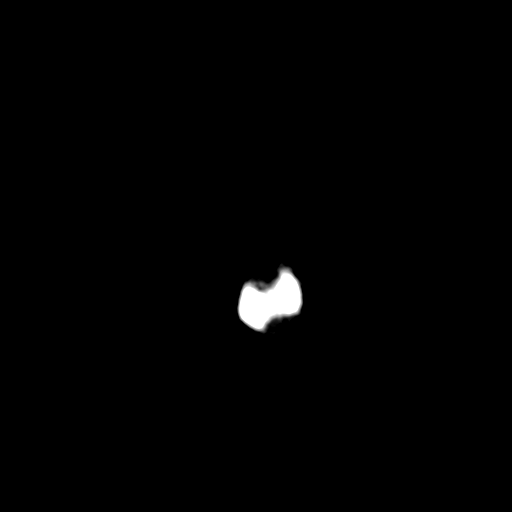

[16 of 30 positions shown; findings below may reference images not displayed]

FINDINGS: Mild ethmoid sinus mucosal thickening.  Some bubbly
opacity in the posterior left ethmoid.  The frontal sinuses are
hypoplastic.  Other Visualized paranasal sinuses and mastoids are
clear.

No acute osseous abnormality identified.  Visualized orbits and
scalp soft tissues are within normal limits.

Cerebral volume is within normal limits for age.  No midline shift,
ventriculomegaly, mass effect, evidence of mass lesion,
intracranial hemorrhage or evidence of cortically based acute
infarction.  Gray-white matter differentiation is within normal
limits throughout the brain.  No suspicious intracranial vascular
hyperdensity.
IMPRESSION: 1. Normal noncontrast CT appearance of the brain.
2.  Ethmoid sinus inflammatory changes, might reflect acute
sinusitis in the appropriate clinical setting.

## 2016-10-22 ENCOUNTER — Emergency Department (HOSPITAL_COMMUNITY)
Admission: EM | Admit: 2016-10-22 | Discharge: 2016-10-24 | Disposition: A | Discharge status: Home / Self Care | Payer: Self-pay | Attending: Emergency Medicine | Admitting: Emergency Medicine

## 2016-10-22 ENCOUNTER — Encounter (HOSPITAL_COMMUNITY): Payer: Self-pay | Admitting: Oncology

## 2016-10-22 DIAGNOSIS — Z87891 Personal history of nicotine dependence: Secondary | ICD-10-CM | POA: Insufficient documentation

## 2016-10-22 DIAGNOSIS — F312 Bipolar disorder, current episode manic severe with psychotic features: Secondary | ICD-10-CM | POA: Diagnosis present

## 2016-10-22 DIAGNOSIS — F29 Unspecified psychosis not due to a substance or known physiological condition: Secondary | ICD-10-CM | POA: Insufficient documentation

## 2016-10-22 DIAGNOSIS — Z79899 Other long term (current) drug therapy: Secondary | ICD-10-CM | POA: Insufficient documentation

## 2016-10-22 LAB — COMPREHENSIVE METABOLIC PANEL
ALT: 21 U/L (ref 14–54)
AST: 24 U/L (ref 15–41)
Albumin: 4.5 g/dL (ref 3.5–5.0)
Alkaline Phosphatase: 56 U/L (ref 38–126)
Anion gap: 7 (ref 5–15)
BUN: 11 mg/dL (ref 6–20)
CHLORIDE: 107 mmol/L (ref 101–111)
CO2: 26 mmol/L (ref 22–32)
CREATININE: 0.49 mg/dL (ref 0.44–1.00)
Calcium: 9.5 mg/dL (ref 8.9–10.3)
GFR calc non Af Amer: >60 mL/min (ref >60)
Glucose, Bld: 115 mg/dL — ABNORMAL HIGH (ref 65–99)
Potassium: 3.3 mmol/L — ABNORMAL LOW (ref 3.5–5.1)
SODIUM: 140 mmol/L (ref 135–145)
Total Bilirubin: 0.4 mg/dL (ref 0.3–1.2)
Total Protein: 7.3 g/dL (ref 6.5–8.1)

## 2016-10-22 LAB — RAPID URINE DRUG SCREEN, HOSP PERFORMED
AMPHETAMINES: NOT DETECTED
BENZODIAZEPINES: NOT DETECTED
Barbiturates: NOT DETECTED
COCAINE: NOT DETECTED
OPIATES: NOT DETECTED
TETRAHYDROCANNABINOL: NOT DETECTED

## 2016-10-22 LAB — CBC
HCT: 38.9 % (ref 36.0–46.0)
HEMOGLOBIN: 13.1 g/dL (ref 12.0–15.0)
MCH: 30.2 pg (ref 26.0–34.0)
MCHC: 33.7 g/dL (ref 30.0–36.0)
MCV: 89.6 fL (ref 78.0–100.0)
Platelets: 335 10*3/uL (ref 150–400)
RBC: 4.34 MIL/uL (ref 3.87–5.11)
RDW: 13 % (ref 11.5–15.5)
WBC: 9.6 10*3/uL (ref 4.0–10.5)

## 2016-10-22 LAB — ETHANOL: Alcohol, Ethyl (B): <5 mg/dL (ref <5)

## 2016-10-22 LAB — SALICYLATE LEVEL

## 2016-10-22 LAB — PREGNANCY, URINE: Preg Test, Ur: NEGATIVE

## 2016-10-22 LAB — ACETAMINOPHEN LEVEL: Acetaminophen (Tylenol), Serum: <10 ug/mL — ABNORMAL LOW (ref 10–30)

## 2016-10-22 MED ORDER — IBUPROFEN 200 MG PO TABS: 600.0000 mg | ORAL_TABLET | Freq: Three times a day (TID) | ORAL | Status: DC | PRN

## 2016-10-22 MED ORDER — ACETAMINOPHEN 325 MG PO TABS: 650.0000 mg | ORAL_TABLET | ORAL | Status: DC | PRN

## 2016-10-22 MED ORDER — LORAZEPAM 1 MG PO TABS
1.0000 mg | ORAL_TABLET | Freq: Three times a day (TID) | ORAL | Status: DC | PRN
  Administered 2016-10-22: 1 mg via ORAL
  Filled 2016-10-22: qty 1

## 2016-10-22 MED ORDER — ONDANSETRON HCL 4 MG PO TABS: 4.0000 mg | ORAL_TABLET | Freq: Three times a day (TID) | ORAL | Status: DC | PRN

## 2016-10-22 MED ORDER — HALOPERIDOL LACTATE 5 MG/ML IJ SOLN: 5.0000 mg | Freq: Once | INTRAMUSCULAR | Status: DC

## 2016-10-22 MED ORDER — ZIPRASIDONE MESYLATE 20 MG IM SOLR
INTRAMUSCULAR | Status: AC
  Filled 2016-10-22: qty 20

## 2016-10-22 MED ORDER — LORAZEPAM 2 MG/ML IJ SOLN
INTRAMUSCULAR | Status: AC
  Filled 2016-10-22: qty 1

## 2016-10-22 MED ORDER — DIPHENHYDRAMINE HCL 50 MG/ML IJ SOLN
25.0000 mg | Freq: Once | INTRAMUSCULAR | Status: AC
  Administered 2016-10-22: 25 mg via INTRAMUSCULAR

## 2016-10-22 MED ORDER — DIPHENHYDRAMINE HCL 50 MG/ML IJ SOLN
INTRAMUSCULAR | Status: AC
  Filled 2016-10-22: qty 1

## 2016-10-22 MED ORDER — ZIPRASIDONE MESYLATE 20 MG IM SOLR
10.0000 mg | Freq: Once | INTRAMUSCULAR | Status: AC
  Administered 2016-10-22: 10 mg via INTRAMUSCULAR

## 2016-10-22 MED ORDER — LORAZEPAM 2 MG/ML IJ SOLN
1.0000 mg | Freq: Once | INTRAMUSCULAR | Status: AC
  Administered 2016-10-22: 1 mg via INTRAMUSCULAR

## 2016-10-22 MED ORDER — ASENAPINE MALEATE 5 MG SL SUBL
5.0000 mg | SUBLINGUAL_TABLET | Freq: Two times a day (BID) | SUBLINGUAL | Status: DC
  Administered 2016-10-22 – 2016-10-24 (×4): 5 mg via SUBLINGUAL
  Filled 2016-10-22 (×4): qty 1

## 2016-10-22 MED ORDER — ALUM & MAG HYDROXIDE-SIMETH 200-200-20 MG/5ML PO SUSP: 30.0000 mL | ORAL | Status: DC | PRN

## 2016-10-22 MED ORDER — STERILE WATER FOR INJECTION IJ SOLN
INTRAMUSCULAR | Status: AC
  Administered 2016-10-22: 10:00:00
  Filled 2016-10-22: qty 10
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
## 2016-10-22 NOTE — ED Notes (Signed)
Pt admitted to room #41 IVC, blank affect. Pt reports she needs to go home. Pt ran towards door. Pt forwards little with this nurse. Encouragement and support provided. Special checks q 15 mins in place for safety. Video monitoring in place. Will continue to monitor.
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
## 2016-10-22 NOTE — ED Notes (Signed)
Pt is currently sleeping. No s/s of distress noted, respirations equal, breathing non labored. Will resume 1:1 per MD order when pt is awake.

## 2016-10-22 NOTE — BH Assessment (Signed)
[facility] Assessment Progress Note  Per Thedore Mins, MD, this pt requires psychiatric hospitalization at this time.  Pt presents under IVC initiated by EDP April Palumbo, MD.  The following facilities have been contacted to seek placement for this pt, with results as noted:  Beds available, information sent, decision pending:  [facility]   At capacity:  [facility] [address] [facility]   Doylene Canning, [place] Triage Specialist [phone]

## 2016-10-22 NOTE — ED Notes (Signed)
Patient calm and visiting with family. Patient denies SI,HI and AVH at this time. Plan of care discussed with patient. Patient voices no complaints or concerns at this time. Encouragement and support provided and safety maintain. Q 15 min safety checks and video monitoring.

## 2016-10-22 NOTE — Progress Notes (Signed)
10/22/16 1248:  LRT informed by staff to let pt stay sleep.  Caroll Rancher, LRT/CTRS
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
## 2016-10-22 NOTE — ED Notes (Signed)
Pt went to restroom and was still not able to produce urine sample. Dr.Palumbo aware and verbal order for In & Out cath to obtain urine sample. Pt is currently still under evaluation by TTS.
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
## 2016-10-22 NOTE — ED Notes (Signed)
Pt is now awake, Pt is now being monitored on 1:1 per MD order.

## 2016-10-22 NOTE — ED Notes (Signed)
1:1 in place; MHT at bedside; Pt is awake, eating dinner, behavior appropriate. Jorene Minors, NP notified of pt behavior, 1:1 while awake d/c at this time.  Special checks q 15 mins in place for safety. Video  Monitoring in place. Will continue to monitor.
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
## 2016-10-22 NOTE — ED Notes (Signed)
TTS at bedside. Pt able to be viewed from nursing station.

## 2016-10-22 NOTE — ED Notes (Signed)
Pt changed into paper scrubs.  Security to bedside to wand pt. Have informed pt multiple times that she is here under IVC however pt continues to state, "I need to go home."

## 2016-10-22 NOTE — ED Notes (Signed)
IM medication given per MD order, [employer] and security present and hands on for medication administration. Pt behavior impossible to redirect, pt hypersexual, taking off clothes, banging on nurses station window. Jorene Minors NP notified of pt behavior.

## 2016-10-22 NOTE — ED Notes (Signed)
Bed: RESB Expected date:  Expected time:  Means of arrival:  Comments: 31 yo F/ PSYCH

## 2016-10-22 NOTE — BH Assessment (Addendum)
Tele Assessment Note   Teresa Kane is an 31 y.o. female, who presents involuntarily and accompanied by her husband to [facility]. During the assessment a Spanish interpreter was used. Pt reported, coming to [facility] due to having a miscarriage. Clinician asked the pt did anything occurred before her miscarriage, pt responded, "I don't remember." Clinician asked the pt questions about her living situation; per the interpreter the pt reported, she was confused and continued to change who lives in her home. The pt also reported, then denied taking medication. Pt reported, her sister took medication. Clinician asked the pt if she turned over furniture, pt denied. Clinician asked the pt if she was having suicidal thoughts, per the interpreter the pt responded, "I just want to rest for a while." Pt denied, SI, HI, AVH and self-injurious behaviors. During the assessment, pt asked if she could use the restroom. Pt and her husband went to the restroom for about ten minutes. Clinician asked the if she has access to weapons, pt continued to change her response per the interpreter. Then the pt asked the interpreted if she can go back to the restroom because she started her period. The pt reported, she can speak [place]. The pt denied having access to weapons. Per the interpreted the pt expressed she was a little confused, clinician asked the interpreter to asked the pt what confused her?  The interpreted reported from the pt, "nothing." As the assessment was coming to end, the pt expressed to the interpreter that she has a rifle at home.   Pt was IVC'd by Dr. Nicanor Alcon. Per IVC paperwork: "Turning over furniture, aggressive not letting people including children sleep in house, is hearing voices and fearful from family, staff, EMS won't let us touch her, danger to kids."   Clinician was unable to assess the pt history of abuse, substance use, linkage to OPT resources, inpatient admissions, orientation.   Pt presented quiet/awake  with circumstantial speech. Pt's eye contact was fair. Pt's mood was anxious. Pt's affect was congruent with mood. Pt's thought process was circumstantial. Pt's judgement was parital. Pt's concentration as normal. Pt's insight and impulse control are poor. During the assessment, the pt appeared to be responding to internal stimuli.   Diagnosis: Unspecified schizophrenia spectrum and other psychotic disorder  Past Medical History: History reviewed. No pertinent past medical history.  History reviewed. No pertinent surgical history.  Family History:  Family History  Problem Relation Age of Onset  . Schizophrenia Mother   . Schizophrenia Other     Social History:  reports that she has quit smoking. She has never used smokeless tobacco. She reports that she does not drink alcohol or use drugs.  Additional Social History:  Alcohol / Drug Use Pain Medications: See MAR Prescriptions: See MAR Over the Counter: See MAR History of alcohol / drug use?:  (Pending UDS.)  CIWA: CIWA-Ar BP: 133/86 Pulse Rate: 80 COWS:    PATIENT STRENGTHS: (choose at least two) Average or above average intelligence General fund of knowledge  Allergies: No Known Allergies  Home Medications:  (Not in a hospital admission)  OB/GYN Status:  No LMP recorded.  General Assessment Data Location of Assessment: [facility] TTS Assessment: In system Is this a Tele or Face-to-Face Assessment?: Face-to-Face Is this an Initial Assessment or a Re-assessment for this encounter?: Initial Assessment Marital status: Single Is patient pregnant?: No Pregnancy Status: No Living Arrangements: Parent, Other relatives, Spouse/significant other Can pt return to current living arrangement?: Yes Admission Status: Involuntary Referral  Source: Self/Family/Friend Insurance type: Selfpay     Crisis Care Plan Living Arrangements: Parent, Other relatives, Spouse/significant other Legal Guardian: Other: (Self) Name of  Psychiatrist: NA Name of Therapist: NA  Education Status Is patient currently in school?:  (UTA) Current Grade: UTA Highest grade of school patient has completed: UTA Name of school: UTA Contact person: UTA  Risk to self with the past 6 months Suicidal Ideation: No (Pt denies. ) Has patient been a risk to self within the past 6 months prior to admission? : No Suicidal Intent: No Has patient had any suicidal intent within the past 6 months prior to admission? : No Is patient at risk for suicide?: No Suicidal Plan?: No Has patient had any suicidal plan within the past 6 months prior to admission? : No Access to Means: No What has been your use of drugs/alcohol within the last 12 months?: Pending UDS.  Previous Attempts/Gestures:  (UTA) How many times?:  (UTA) Other Self Harm Risks: Pt denies.  Triggers for Past Attempts: Other (Comment) (UTA) Intentional Self Injurious Behavior: None (Pt denies. ) Family Suicide History: Unable to assess Recent stressful life event(s): Other (Comment) (UTA) Persecutory voices/beliefs?:  Rich Reining) Depression:  (UTA) Substance abuse history and/or treatment for substance abuse?:  (Pending UDS.) Suicide prevention information given to non-admitted patients: Not applicable  Risk to Others within the past 6 months Homicidal Ideation:  (Pt denies. ) Does patient have any lifetime risk of violence toward others beyond the six months prior to admission? : No Thoughts of Harm to Others: No Current Homicidal Intent: No Current Homicidal Plan: No Access to Homicidal Means: No Identified Victim: NA History of harm to others?: No Assessment of Violence: None Noted Violent Behavior Description: UTA Does patient have access to weapons?: Yes (Comment) (rifle.) Criminal Charges Pending?:  (UTA) Does patient have a court date:  (UTA) Is patient on probation?:  (UTA)  Psychosis Hallucinations: Auditory Delusions: Unspecified  Mental Status  Report Appearance/Hygiene: Disheveled Eye Contact: Fair Motor Activity: Unremarkable Speech: Other (Comment) (circumstantial. ) Level of Consciousness: Quiet/awake Thought Processes: Circumstantial Judgement: Impaired Orientation: Unable to assess Obsessive Compulsive Thoughts/Behaviors: Unable to Assess  Cognitive Functioning Concentration: Normal Memory: Unable to Assess IQ: Average Insight: Poor Impulse Control: Poor Appetite:  (UTA) Sleep: Unable to Assess Vegetative Symptoms: Unable to Assess  ADLScreening [facility] Assessment Services) Patient's cognitive ability adequate to safely complete daily activities?: Yes Patient able to express need for assistance with ADLs?: Yes Independently performs ADLs?: Yes (appropriate for developmental age)  Prior Inpatient Therapy Prior Inpatient Therapy:  (UTA) Prior Therapy Dates: UTA Prior Therapy Facilty/Provider(s): UTA Reason for Treatment: UTA  Prior Outpatient Therapy Prior Outpatient Therapy:  (UTA) Prior Therapy Dates: UTA Prior Therapy Facilty/Provider(s): UTA Reason for Treatment: UTA Does patient have an ACCT team?: Unknown Does patient have Intensive In-House Services?  : Unknown Does patient have [facility] services? : Unknown Does patient have P4CC services?: Unknown  ADL Screening (condition at time of admission) Patient's cognitive ability adequate to safely complete daily activities?: Yes Is the patient deaf or have difficulty hearing?: No Does the patient have difficulty seeing, even when wearing glasses/contacts?:  (UTA) Does the patient have difficulty concentrating, remembering, or making decisions?: Yes Patient able to express need for assistance with ADLs?: Yes Does the patient have difficulty dressing or bathing?: No Independently performs ADLs?: Yes (appropriate for developmental age) Does the patient have difficulty walking or climbing stairs?: No Weakness of Legs: None Weakness of Arms/Hands: None  Abuse/Neglect Assessment (Assessment to be complete while patient is alone) Physical Abuse:  (UTA) Verbal Abuse:  (UTA) Sexual Abuse:  (UTA) Exploitation of patient/patient's resources:  Industrial/product designer) Self-Neglect:  (UTA)     Advance Directives (For Healthcare) Does Patient Have a Medical Advance Directive?: No Would patient like information on creating a medical advance directive?: No - Patient declined    Additional Information 1:1 In Past 12 Months?: No CIRT Risk: No Elopement Risk: No Does patient have medical clearance?: Yes     Disposition: Teresa Conn, NP recommends inpatient treatment, Dr. Nicanor Alcon is in agreement. Disposition discussed with Teresa Rainbow, RN. Per Teresa Kane, [facility] no appropriate beds available. TTS to seek placement. Disposition Initial Assessment Completed for this Encounter: Yes Disposition of Patient: Inpatient treatment program Type of inpatient treatment program: Adult  Gwinda Passe 10/22/2016 6:38 AM   Gwinda Passe, MS, [employer], [facility] Triage Specialist [phone]

## 2016-10-23 MED ORDER — OLANZAPINE 10 MG IM SOLR
10.0000 mg | Freq: Once | INTRAMUSCULAR | Status: AC | PRN
  Administered 2016-10-23: 10 mg via INTRAMUSCULAR
  Filled 2016-10-23: qty 10

## 2016-10-23 MED ORDER — POTASSIUM CHLORIDE CRYS ER 20 MEQ PO TBCR
20.0000 meq | EXTENDED_RELEASE_TABLET | Freq: Once | ORAL | Status: AC
  Administered 2016-10-23: 20 meq via ORAL
  Filled 2016-10-23: qty 1

## 2016-10-23 MED ORDER — DIPHENHYDRAMINE HCL 50 MG/ML IJ SOLN
50.0000 mg | Freq: Once | INTRAMUSCULAR | Status: AC
  Administered 2016-10-23: 50 mg via INTRAMUSCULAR
  Filled 2016-10-23: qty 1

## 2016-10-23 NOTE — BH Assessment (Signed)
[facility] Assessment Progress Note  Per Thedore Mins, MD, this pt continues to require psychiatric hospitalization at this time.  The following facilities have been contacted to seek placement for this pt, with results as noted:  Beds available, information sent, decision pending:  [facility]   At capacity:  [facility] [place], [place] Triage Specialist [phone]

## 2016-10-23 NOTE — ED Notes (Signed)
Pt went into the bathroom where a female patient was and he started yelling for help because she had her hands on him. Staff and security redirected her. She had tried to pull the cords out of walls in the day room and just now was seen in the camera washing her hands in the toilet.

## 2016-10-23 NOTE — ED Notes (Signed)
Pt cried out loud in her room and when this writer checked on her she was crying that she missed her family. Reassured her that she will be seeing her family soon, and that she will not be in the hospital for a long time. She got up and wanted to hug for reassurance. It required several attempts at redirection to get her to stop attempting to hug. She said she needs a hug. She finally accepted that it is not appropriate for [place] to be hugging.

## 2016-10-23 NOTE — Consult Note (Signed)
[place] Psychiatry Consult   Reason for Consult: psychiatric evaluation  Referring Physician: EDP Patient Identification: Teresa Kane MRN:  [id number] Principal Diagnosis: Bipolar affective disorder, current episode manic with psychotic symptoms ([place]) Diagnosis:   Patient Active Problem List   Diagnosis Date Noted  . Bipolar affective disorder, current episode manic with psychotic symptoms ([place]) [F31.2] 10/22/2016    Priority: High    Total Time spent with patient: 45 minutes  Subjective:   Teresa Kane is a 31 y.o. female patient admitted with aggression and psychosis.  HPI: Patient with prior history of psychotic break few years ago. She was brought to [facility] 2 days ago under IVC'd due to acute psychosis, delusions, aggressive behavior, agitation and mood swings. Patient reports hearing voices, decreased need for sleep, racing thoughts and being fearful. She reportedly prevented her children from sleeping and running her family away from home. Patient remains labile, oppositional, aggressive and paranoid.   Past Psychiatric History: as above  Risk to Self: Suicidal Ideation: No (Pt denies. ) Suicidal Intent: No Is patient at risk for suicide?: No Suicidal Plan?: No Access to Means: No What has been your use of drugs/alcohol within the last 12 months?: Pending UDS.  How many times?:  (UTA) Other Self Harm Risks: Pt denies.  Triggers for Past Attempts: Other (Comment) (UTA) Intentional Self Injurious Behavior: None (Pt denies. ) Risk to Others: Homicidal Ideation:  (Pt denies. ) Thoughts of Harm to Others: No Current Homicidal Intent: No Current Homicidal Plan: No Access to Homicidal Means: No Identified Victim: NA History of harm to others?: No Assessment of Violence: None Noted Violent Behavior Description: UTA Does patient have access to weapons?: Yes (Comment) (rifle.) Criminal Charges Pending?:  (UTA) Does patient have a court date:  (UTA) Prior Inpatient  Therapy: Prior Inpatient Therapy:  (UTA) Prior Therapy Dates: UTA Prior Therapy Facilty/Provider(s): UTA Reason for Treatment: UTA Prior Outpatient Therapy: Prior Outpatient Therapy:  (UTA) Prior Therapy Dates: UTA Prior Therapy Facilty/Provider(s): UTA Reason for Treatment: UTA Does patient have an ACCT team?: Unknown Does patient have Intensive In-House Services?  : Unknown Does patient have [facility] services? : Unknown Does patient have P4CC services?: Unknown  Past Medical History: History reviewed. No pertinent past medical history. History reviewed. No pertinent surgical history. Family History:  Family History  Problem Relation Age of Onset  . Schizophrenia Mother   . Schizophrenia Other    Family Psychiatric  History:  Social History:  History  Alcohol Use No     History  Drug Use No    Social History   Social History  . Marital status: Single    Spouse name: N/A  . Number of children: N/A  . Years of education: N/A   Social History Main Topics  . Smoking status: Former Research scientist (life sciences)  . Smokeless tobacco: Never Used  . Alcohol use No  . Drug use: No  . Sexual activity: Yes   Other Topics Concern  . None   Social History Narrative  . None   Additional Social History:    Allergies:  No Known Allergies  Labs:  Results for orders placed or performed during the hospital encounter of 10/22/16 (from the past 48 hour(s))  Comprehensive metabolic panel     Status: Abnormal   Collection Time: 10/22/16  4:44 AM  Result Value Ref Range   Sodium 140 135 - 145 mmol/L   Potassium 3.3 (L) 3.5 - 5.1 mmol/L   Chloride 107 101 - 111 mmol/L   CO2  26 22 - 32 mmol/L   Glucose, Bld 115 (H) 65 - 99 mg/dL   BUN 11 6 - 20 mg/dL   Creatinine, Ser 0.49 0.44 - 1.00 mg/dL   Calcium 9.5 8.9 - 10.3 mg/dL   Total Protein 7.3 6.5 - 8.1 g/dL   Albumin 4.5 3.5 - 5.0 g/dL   AST 24 15 - 41 U/L   ALT 21 14 - 54 U/L   Alkaline Phosphatase 56 38 - 126 U/L   Total Bilirubin 0.4 0.3 -  1.2 mg/dL   GFR calc non Af Amer >60 >60 mL/min   GFR calc Af Amer >60 >60 mL/min    Comment: (NOTE) The eGFR has been calculated using the CKD EPI equation. This calculation has not been validated in all clinical situations. eGFR's persistently <60 mL/min signify possible Chronic Kidney Disease.    Anion gap 7 5 - 15  Ethanol     Status: None   Collection Time: 10/22/16  4:44 AM  Result Value Ref Range   Alcohol, Ethyl (B) <5 <5 mg/dL    Comment:        LOWEST DETECTABLE LIMIT FOR SERUM ALCOHOL IS 5 mg/dL FOR MEDICAL PURPOSES ONLY   Salicylate level     Status: None   Collection Time: 10/22/16  4:44 AM  Result Value Ref Range   Salicylate Lvl <0.6 2.8 - 30.0 mg/dL  Acetaminophen level     Status: Abnormal   Collection Time: 10/22/16  4:44 AM  Result Value Ref Range   Acetaminophen (Tylenol), Serum <10 (L) 10 - 30 ug/mL    Comment:        THERAPEUTIC CONCENTRATIONS VARY SIGNIFICANTLY. A RANGE OF 10-30 ug/mL MAY BE AN EFFECTIVE CONCENTRATION FOR MANY PATIENTS. HOWEVER, SOME ARE BEST TREATED AT CONCENTRATIONS OUTSIDE THIS RANGE. ACETAMINOPHEN CONCENTRATIONS >150 ug/mL AT 4 HOURS AFTER INGESTION AND >50 ug/mL AT 12 HOURS AFTER INGESTION ARE OFTEN ASSOCIATED WITH TOXIC REACTIONS.   cbc     Status: None   Collection Time: 10/22/16  4:44 AM  Result Value Ref Range   WBC 9.6 4.0 - 10.5 K/uL   RBC 4.34 3.87 - 5.11 MIL/uL   Hemoglobin 13.1 12.0 - 15.0 g/dL   HCT 38.9 36.0 - 46.0 %   MCV 89.6 78.0 - 100.0 fL   MCH 30.2 26.0 - 34.0 pg   MCHC 33.7 30.0 - 36.0 g/dL   RDW 13.0 11.5 - 15.5 %   Platelets 335 150 - 400 K/uL  Rapid urine drug screen (hospital performed)     Status: None   Collection Time: 10/22/16  6:15 AM  Result Value Ref Range   Opiates NONE DETECTED NONE DETECTED   Cocaine NONE DETECTED NONE DETECTED   Benzodiazepines NONE DETECTED NONE DETECTED   Amphetamines NONE DETECTED NONE DETECTED   Tetrahydrocannabinol NONE DETECTED NONE DETECTED   Barbiturates  NONE DETECTED NONE DETECTED    Comment:        DRUG SCREEN FOR MEDICAL PURPOSES ONLY.  IF CONFIRMATION IS NEEDED FOR ANY PURPOSE, NOTIFY LAB WITHIN 5 DAYS.        LOWEST DETECTABLE LIMITS FOR URINE DRUG SCREEN Drug Class       Cutoff (ng/mL) Amphetamine      1000 Barbiturate      200 Benzodiazepine   301 Tricyclics       601 Opiates          300 Cocaine          300 THC  50   Pregnancy, urine     Status: None   Collection Time: 10/22/16  6:15 AM  Result Value Ref Range   Preg Test, Ur NEGATIVE NEGATIVE    Comment:        THE SENSITIVITY OF THIS METHODOLOGY IS >20 mIU/mL.     Current Facility-Administered Medications  Medication Dose Route Frequency Provider Last Rate Last Dose  . acetaminophen (TYLENOL) tablet 650 mg  650 mg Oral Q4H PRN April Palumbo, MD      . alum & mag hydroxide-simeth (MAALOX/MYLANTA) 200-200-20 MG/5ML suspension 30 mL  30 mL Oral PRN April Palumbo, MD      . asenapine (SAPHRIS) sublingual tablet 5 mg  5 mg Sublingual BID Corena Pilgrim, MD   5 mg at 10/23/16 1006  . haloperidol lactate (HALDOL) injection 5 mg  5 mg Intramuscular Once April Palumbo, MD      . ibuprofen (ADVIL,MOTRIN) tablet 600 mg  600 mg Oral Q8H PRN April Palumbo, MD      . ondansetron [facility]) tablet 4 mg  4 mg Oral Q8H PRN April Palumbo, MD       Current Outpatient Prescriptions  Medication Sig Dispense Refill  . Multiple Vitamin (MULTIVITAMIN WITH MINERALS) TABS Take 1 tablet by mouth daily.      Musculoskeletal: Strength & Muscle Tone: within normal limits Gait & Station: normal Patient leans: N/A  Psychiatric Specialty Exam: Physical Exam  Psychiatric: Her mood appears anxious. Her affect is labile. Her speech is rapid and/or pressured. She is agitated, aggressive, hyperactive and actively hallucinating. Thought content is paranoid. Cognition and memory are normal. She expresses impulsivity.    Review of Systems  Constitutional: Negative.   HENT: Negative.    Eyes: Negative.   Respiratory: Negative.   Cardiovascular: Negative.   Gastrointestinal: Negative.   Genitourinary: Negative.   Musculoskeletal: Negative.   Skin: Negative.   Neurological: Negative.   Endo/Heme/Allergies: Negative.   Psychiatric/Behavioral: Negative.     Blood pressure 137/81, pulse 86, temperature 98.3 F (36.8 C), temperature source Oral, resp. rate 18, SpO2 96 %.There is no height or weight on file to calculate BMI.  General Appearance: Casual  Eye Contact:  Minimal  Speech:  Pressured  Volume:  Increased  Mood:  Angry and Irritable  Affect:  Labile  Thought Process:  Disorganized  Orientation:  Full (Time, Place, and Person)  Thought Content:  Delusions, Hallucinations: Auditory and Paranoid Ideation  Suicidal Thoughts:  No  Homicidal Thoughts:  No  Memory:  Immediate;   Fair Recent;   Fair Remote;   Good  Judgement:  Poor  Insight:  Lacking  Psychomotor Activity:  Increased and Restlessness  Concentration:  Concentration: Poor and Attention Span: Poor  Recall:  Good  Fund of Knowledge:  Good  Language:  Good  Akathisia:  No  Handed:  Right  AIMS (if indicated):     Assets:  Communication Skills Social Support  ADL's:  Intact  Cognition:  WNL  Sleep:   poor     Treatment Plan Summary: Daily contact with patient to assess and evaluate symptoms and progress in treatment and Medication management  Continue Saphris 5 mg bid for mood and psychosis  Disposition: Recommend psychiatric Inpatient admission when medically cleared.  Corena Pilgrim, MD 10/23/2016 10:40 AM

## 2016-10-23 NOTE — ED Notes (Signed)
Introduced self to patient. Pt oriented to unit expectations.  Assessed pt for:  A) Anxiety &/or agitation: Pt has been calm, cooperative and coherent today. She denies SI/HI and denies AVH. She continues to be anxious when her family leaves after visitation, but has remained in control of her behavior. This morning she said that she does not want to take medication, but she agreed to take it because she wants to be able to go home soon. She misses her child and family. She does not appear to be responding to internal stimuli. Pt's sister Trula Ore, who is on her consent list to receive information, shared that pt is usually the pillar-of-strength in their family. She works, she takes care of the family and is there for everyone. A break-down like this occurred about 2 years ago. Pt had stopped taking medications believing that she did not need them. Trula Ore and another sister have "mental problems" and take medications. Pt speaks English.   S) Safety: Safety maintained with q-15-minute checks and hourly rounds by staff.  A) ADLs: Pt able to perform ADLs independently.  P) Pick-Up (room cleanliness): Pt's room clean and free of clutter.

## 2016-10-23 NOTE — Progress Notes (Signed)
10/23/16 1400:  LRT introduced self to pt and offered activities.  Pt was lying in bed awake.  Pt came down to the dayroom for activities.  LRT taught pt how to play UNO.  Pt and LRT played 3 games of UNO before playing a game of checkers.  Pt was pleasant and bright.   Caroll Rancher, LRT/CTRS

## 2016-10-23 NOTE — ED Notes (Signed)
Pt given IM medication for agitation: See MAR. Required hands on to be given. She tried to bargain saying, "If you will let that other pt go then I will let you give me the medicine."

## 2016-10-24 DIAGNOSIS — Z87891 Personal history of nicotine dependence: Secondary | ICD-10-CM

## 2016-10-24 DIAGNOSIS — Z79899 Other long term (current) drug therapy: Secondary | ICD-10-CM

## 2016-10-24 DIAGNOSIS — Z818 Family history of other mental and behavioral disorders: Secondary | ICD-10-CM

## 2016-10-24 DIAGNOSIS — F312 Bipolar disorder, current episode manic severe with psychotic features: Secondary | ICD-10-CM

## 2016-10-24 MED ORDER — ASENAPINE MALEATE 5 MG SL SUBL: 5.0000 mg | SUBLINGUAL_TABLET | Freq: Every day | SUBLINGUAL | 0 refills | Status: DC

## 2016-10-24 MED ORDER — ASENAPINE MALEATE 5 MG SL SUBL: 5.0000 mg | SUBLINGUAL_TABLET | Freq: Every day | SUBLINGUAL | Status: DC

## 2016-10-24 NOTE — BH Assessment (Signed)
[facility] Assessment Progress Note  Per Thedore Mins, MD, this pt does not require psychiatric hospitalization at this time.  Pt presents under IVC initiated by EDP April Palumbo, MD, whith Dr Jannifer Franklin has rescinded.  Pt is to be discharged from [facility] with referral information for [facility].  This has been included in pt's discharge instructions.  Pt's nurse, Morrie Sheldon, has been notified.  Doylene Canning, MA Triage Specialist [phone]

## 2016-10-24 NOTE — Consult Note (Signed)
[facility] Face-to-Face Psychiatry Consult   Reason for Consult: psychiatric evaluation  Referring Physician: EDP Patient Identification: Teresa Kane MRN:  [id number] Principal Diagnosis: Bipolar affective disorder, current episode manic with psychotic symptoms (HCC) Diagnosis:   Patient Active Problem List   Diagnosis Date Noted  . Bipolar affective disorder, current episode manic with psychotic symptoms (HCC) [F31.2] 10/22/2016    Priority: High    Total Time spent with patient: 45 minutes  Subjective:   Teresa Kane is a 31 y.o. female patient admitted with aggression and psychosis. Pt seen and chart reviewed. Pt is alert/oriented x4, calm, cooperative, and appropriate to situation. Pt denies suicidal/homicidal ideation and psychosis and does not appear to be responding to internal stimuli. Pt's sister is present and concurs that she feels that pt would be safe to come home and will help her go to her followup psychiatric appointments. Pt in agreement to be compliant with medications as well.   HPI: I have reviewed and concur with HPI elements below, modified as follows: Patient, with prior history of psychotic break few years ago, brought to [facility] 2 days ago under IVC'd due to acute psychosis, delusions, aggressive behavior, agitation and mood swings. Patient reports hearing voices, decreased need for sleep, racing thoughts and being fearful. She reportedly prevented her children from sleeping and running her family away from home. Patient remains labile, oppositional, aggressive and paranoid.   Interval History 10/24/16: Pt spent the night in the ED without incident and has been calm and cooperative with staff. Seen above today for psychiatric evaluation.  Past Psychiatric History: as above  Risk to Self: Suicidal Ideation: No (Pt denies. ) Suicidal Intent: No Is patient at risk for suicide?: No Suicidal Plan?: No Access to Means: No What has been your use of drugs/alcohol within the last  12 months?: Pending UDS.  How many times?:  (UTA) Other Self Harm Risks: Pt denies.  Triggers for Past Attempts: Other (Comment) (UTA) Intentional Self Injurious Behavior: None (Pt denies. ) Risk to Others: Homicidal Ideation:  (Pt denies. ) Thoughts of Harm to Others: No Current Homicidal Intent: No Current Homicidal Plan: No Access to Homicidal Means: No Identified Victim: NA History of harm to others?: No Assessment of Violence: None Noted Violent Behavior Description: UTA Does patient have access to weapons?: Yes (Comment) (rifle.) Criminal Charges Pending?:  (UTA) Does patient have a court date:  (UTA) Prior Inpatient Therapy: Prior Inpatient Therapy:  (UTA) Prior Therapy Dates: UTA Prior Therapy Facilty/Provider(s): UTA Reason for Treatment: UTA Prior Outpatient Therapy: Prior Outpatient Therapy:  (UTA) Prior Therapy Dates: UTA Prior Therapy Facilty/Provider(s): UTA Reason for Treatment: UTA Does patient have an ACCT team?: Unknown Does patient have Intensive In-House Services?  : Unknown Does patient have [facility] services? : Unknown Does patient have P4CC services?: Unknown  Past Medical History: History reviewed. No pertinent past medical history. History reviewed. No pertinent surgical history. Family History:  Family History  Problem Relation Age of Onset  . Schizophrenia Mother   . Schizophrenia Other    Family Psychiatric  History:  Social History:  History  Alcohol Use No     History  Drug Use No    Social History   Social History  . Marital status: Single    Spouse name: N/A  . Number of children: N/A  . Years of education: N/A   Social History Main Topics  . Smoking status: Former Games developer  . Smokeless tobacco: Never Used  . Alcohol use No  . Drug use:  No  . Sexual activity: Yes   Other Topics Concern  . None   Social History Narrative  . None   Additional Social History:    Allergies:  No Known Allergies  Labs:  No results found  for this or any previous visit (from the past 48 hour(s)).  Current Facility-Administered Medications  Medication Dose Route Frequency Provider Last Rate Last Dose  . acetaminophen (TYLENOL) tablet 650 mg  650 mg Oral Q4H PRN April Palumbo, MD      . alum & mag hydroxide-simeth (MAALOX/MYLANTA) 200-200-20 MG/5ML suspension 30 mL  30 mL Oral PRN April Palumbo, MD      . Melene Muller ON 10/25/2016] asenapine (SAPHRIS) sublingual tablet 5 mg  5 mg Sublingual QHS Inita Uram, MD      . haloperidol lactate (HALDOL) injection 5 mg  5 mg Intramuscular Once April Palumbo, MD      . ibuprofen (ADVIL,MOTRIN) tablet 600 mg  600 mg Oral Q8H PRN April Palumbo, MD      . ondansetron [facility]) tablet 4 mg  4 mg Oral Q8H PRN April Palumbo, MD       Current Outpatient Prescriptions  Medication Sig Dispense Refill  . Multiple Vitamin (MULTIVITAMIN WITH MINERALS) TABS Take 1 tablet by mouth daily.      Musculoskeletal: Strength & Muscle Tone: within normal limits Gait & Station: normal Patient leans: N/A  Psychiatric Specialty Exam: Physical Exam  Psychiatric: Cognition and memory are normal.    Review of Systems  Constitutional: Negative.   HENT: Negative.   Eyes: Negative.   Respiratory: Negative.   Cardiovascular: Negative.   Gastrointestinal: Negative.   Genitourinary: Negative.   Musculoskeletal: Negative.   Skin: Negative.   Neurological: Negative.   Endo/Heme/Allergies: Negative.   Psychiatric/Behavioral: Negative for hallucinations, substance abuse and suicidal ideas. The patient is nervous/anxious and has insomnia.   All other systems reviewed and are negative.   Blood pressure 134/89, pulse (!) 104, temperature 98 F (36.7 C), temperature source Oral, resp. rate 17, SpO2 100 %.There is no height or weight on file to calculate BMI.  General Appearance: Casual and Fairly Groomed  Eye Contact:  Good  Speech:  Clear and Coherent and Normal Rate  Volume:  Normal  Mood:  Anxious  Affect:   Appropriate and Congruent  Thought Process:  Coherent, Goal Directed, Linear and Descriptions of Associations: Intact  Orientation:  Full (Time, Place, and Person)  Thought Content:  Focused on going home with her family  Suicidal Thoughts:  No  Homicidal Thoughts:  No  Memory:  Immediate;   Fair Recent;   Fair Remote;   Good  Judgement:  Poor  Insight:  Lacking  Psychomotor Activity:  Normal and Restlessness  Concentration:  Concentration: Fair and Attention Span: Fair  Recall:  Good  Fund of Knowledge:  Good  Language:  Good  Akathisia:  No  Handed:  Right  AIMS (if indicated):     Assets:  Communication Skills Social Support  ADL's:  Intact  Cognition:  WNL  Sleep:   poor   Treatment Plan Summary: Bipolar affective disorder, current episode manic with psychotic symptoms (HCC) improving, stable for outpatient management, treated as below  Continue Saphris 5 mg bid for mood and psychosis  Disposition: No evidence of imminent risk to self or others at present.   Patient does not meet criteria for psychiatric inpatient admission. Supportive therapy provided about ongoing stressors. Discussed crisis plan, support from social network, calling 911, coming to the  Emergency Department, and calling Suicide Hotline.  Beau Fanny, [place] 10/24/2016 10:48 AM  Patient seen face-to-face for psychiatric evaluation, chart reviewed and case discussed with the physician extender and developed treatment plan. Reviewed the information documented and agree with the treatment plan. Thedore Mins, MD

## 2016-10-24 NOTE — [facility] Suicide Risk Assessment (Signed)
[facility] Discharge Suicide Risk Assessment   Principal Problem: Bipolar affective disorder, current episode manic with psychotic symptoms [facility]) Discharge Diagnoses:  Patient Active Problem List   Diagnosis Date Noted  . Bipolar affective disorder, current episode manic with psychotic symptoms (HCC) [F31.2] 10/22/2016    Priority: High    Total Time spent with patient: 15 minutes  Musculoskeletal: Strength & Muscle Tone: within normal limits Gait & Station: normal Patient leans: N/A  Psychiatric Specialty Exam:   Blood pressure 134/89, pulse (!) 104, temperature 98 F (36.7 C), temperature source Oral, resp. rate 17, SpO2 100 %.There is no height or weight on file to calculate BMI.   Physical Exam  Psychiatric: Cognition and memory are normal.    Review of Systems  Constitutional: Negative.   HENT: Negative.   Eyes: Negative.   Respiratory: Negative.   Cardiovascular: Negative.   Gastrointestinal: Negative.   Genitourinary: Negative.   Musculoskeletal: Negative.   Skin: Negative.   Neurological: Negative.   Endo/Heme/Allergies: Negative.   Psychiatric/Behavioral: Negative for hallucinations, substance abuse and suicidal ideas. The patient is nervous/anxious and has insomnia.   All other systems reviewed and are negative.     General Appearance: Casual and Fairly Groomed  Eye Contact:  Good  Speech:  Clear and Coherent and Normal Rate  Volume:  Normal  Mood:  Anxious  Affect:  Appropriate and Congruent  Thought Process:  Coherent, Goal Directed, Linear and Descriptions of Associations: Intact  Orientation:  Full (Time, Place, and Person)  Thought Content:  Focused on going home with her family  Suicidal Thoughts:  No  Homicidal Thoughts:  No  Memory:  Immediate;   Fair Recent;   Fair Remote;   Good  Judgement:  Poor  Insight:  Lacking  Psychomotor Activity:  Normal and Restlessness  Concentration:  Concentration: Fair and Attention Span: Fair  Recall:  Good   Fund of Knowledge:  Good  Language:  Good  Akathisia:  No  Handed:  Right  AIMS (if indicated):     Assets:  Communication Skills Social Support  ADL's:  Intact  Cognition:  WNL  Sleep:   poor     Mental Status Per Nursing Assessment::   On Admission:     Demographic Factors:  NA  Loss Factors: NA  Historical Factors: Impulsivity  Risk Reduction Factors:   Responsible for children under 48 years of age, Sense of responsibility to family, Employed, Positive social support and Positive coping skills or problem solving skills  Continued Clinical Symptoms:  Depression:   Impulsivity  Cognitive Features That Contribute To Risk:  Closed-mindedness    Suicide Risk:  Minimal: No identifiable suicidal ideation.  Patients presenting with no risk factors but with morbid ruminations; may be classified as minimal risk based on the severity of the depressive symptoms    Plan Of Care/Follow-up recommendations:  Activity:  As tolerated Diet:  Heart healthy with low sodium.  Beau Fanny, FNP 10/24/2016, 11:03 AM

## 2016-10-24 NOTE — Discharge Instructions (Signed)
For your ongoing mental health needs, you are advised to follow up with [facility].  New and returning patients are seen at their walk-in clinic.  Walk-in hours are Monday - Friday from 8:00 am - 3:00 pm.  Walk-in patients are seen on a first come, first served basis.  Try to arrive as early as possible for he best chance of being seen the same day: ° °     [facility] °     [address] °     [place], [place] [address] °     [phone] °

## 2016-10-24 NOTE — ED Notes (Signed)
Pt d/c home per MD order. Discharge summary reviewed with pt. Pt verbalizes understanding of discharge summary. RX provided. Pt denies SI/HI/AVH. Pt refused to sign for personal property, but personal property returned to pt. Pt refused to sign e-signature. Pt ambulatory off unit with MHT. Pt family is lobby.

## 2017-07-26 ENCOUNTER — Emergency Department (HOSPITAL_COMMUNITY)
Admission: EM | Admit: 2017-07-26 | Discharge: 2017-08-02 | Disposition: A | Discharge status: Home / Self Care | Payer: Self-pay | Attending: Emergency Medicine | Admitting: Emergency Medicine

## 2017-07-26 ENCOUNTER — Encounter (HOSPITAL_COMMUNITY): Payer: Self-pay | Admitting: Emergency Medicine

## 2017-07-26 ENCOUNTER — Other Ambulatory Visit: Payer: Self-pay

## 2017-07-26 DIAGNOSIS — F69 Unspecified disorder of adult personality and behavior: Secondary | ICD-10-CM

## 2017-07-26 DIAGNOSIS — Z008 Encounter for other general examination: Secondary | ICD-10-CM

## 2017-07-26 DIAGNOSIS — Z87891 Personal history of nicotine dependence: Secondary | ICD-10-CM | POA: Insufficient documentation

## 2017-07-26 DIAGNOSIS — Z79899 Other long term (current) drug therapy: Secondary | ICD-10-CM | POA: Insufficient documentation

## 2017-07-26 DIAGNOSIS — F312 Bipolar disorder, current episode manic severe with psychotic features: Secondary | ICD-10-CM | POA: Diagnosis present

## 2017-07-26 HISTORY — DX: Bipolar disorder, unspecified: F31.9

## 2017-07-26 HISTORY — DX: Anxiety disorder, unspecified: F41.9

## 2017-07-26 LAB — CBC WITH DIFFERENTIAL/PLATELET
Basophils Absolute: 0 10*3/uL (ref 0.0–0.1)
Basophils Relative: 0 %
EOS ABS: 0 10*3/uL (ref 0.0–0.7)
EOS PCT: 0 %
HCT: 38.2 % (ref 36.0–46.0)
HEMOGLOBIN: 12.4 g/dL (ref 12.0–15.0)
LYMPHS ABS: 1.7 10*3/uL (ref 0.7–4.0)
Lymphocytes Relative: 18 %
MCH: 29.7 pg (ref 26.0–34.0)
MCHC: 32.5 g/dL (ref 30.0–36.0)
MCV: 91.4 fL (ref 78.0–100.0)
Monocytes Absolute: 0.4 10*3/uL (ref 0.1–1.0)
Monocytes Relative: 4 %
NEUTROS PCT: 78 %
Neutro Abs: 7.5 10*3/uL (ref 1.7–7.7)
Platelets: 273 10*3/uL (ref 150–400)
RBC: 4.18 MIL/uL (ref 3.87–5.11)
RDW: 12.8 % (ref 11.5–15.5)
WBC: 9.5 10*3/uL (ref 4.0–10.5)

## 2017-07-26 LAB — RAPID URINE DRUG SCREEN, HOSP PERFORMED
Amphetamines: NOT DETECTED
Barbiturates: NOT DETECTED
Benzodiazepines: NOT DETECTED
Cocaine: NOT DETECTED
OPIATES: NOT DETECTED
Tetrahydrocannabinol: NOT DETECTED

## 2017-07-26 LAB — BASIC METABOLIC PANEL
Anion gap: 11 (ref 5–15)
BUN: 10 mg/dL (ref 6–20)
CHLORIDE: 104 mmol/L (ref 101–111)
CO2: 23 mmol/L (ref 22–32)
CREATININE: 0.54 mg/dL (ref 0.44–1.00)
Calcium: 9.3 mg/dL (ref 8.9–10.3)
Glucose, Bld: 113 mg/dL — ABNORMAL HIGH (ref 65–99)
POTASSIUM: 3.6 mmol/L (ref 3.5–5.1)
SODIUM: 138 mmol/L (ref 135–145)

## 2017-07-26 LAB — I-STAT BETA HCG BLOOD, ED (MC, WL, AP ONLY): I-stat hCG, quantitative: <5 m[IU]/mL (ref <5)

## 2017-07-26 MED ORDER — ASENAPINE MALEATE 5 MG SL SUBL
5.0000 mg | SUBLINGUAL_TABLET | Freq: Two times a day (BID) | SUBLINGUAL | Status: DC
  Administered 2017-07-26 – 2017-07-31 (×11): 5 mg via SUBLINGUAL
  Filled 2017-07-26 (×15): qty 1

## 2017-07-26 MED ORDER — LORAZEPAM 2 MG/ML IJ SOLN
INTRAMUSCULAR | Status: AC
  Filled 2017-07-26: qty 1

## 2017-07-26 MED ORDER — ZIPRASIDONE MESYLATE 20 MG IM SOLR
10.0000 mg | Freq: Once | INTRAMUSCULAR | Status: AC
  Administered 2017-07-26: 10 mg via INTRAMUSCULAR
  Filled 2017-07-26: qty 20

## 2017-07-26 MED ORDER — LORAZEPAM 2 MG/ML IJ SOLN
2.0000 mg | Freq: Once | INTRAMUSCULAR | Status: AC
  Administered 2017-07-26: 2 mg via INTRAMUSCULAR

## 2017-07-26 MED ORDER — STERILE WATER FOR INJECTION IJ SOLN
INTRAMUSCULAR | Status: AC
  Administered 2017-07-26: 1.2 mL
  Filled 2017-07-26: qty 10

## 2017-07-26 NOTE — ED Triage Notes (Addendum)
Patient arrived with family reported pt.'s abnormal/bizarre behavior onset this evening after arriving from work , confused /disoriented at arrival , poor historian / restless , refused vital signs , denies pain /respirations unlabored. History of psychosis .

## 2017-07-26 NOTE — ED Notes (Signed)
Regular Diet Ordered for Lunch. 

## 2017-07-26 NOTE — ED Notes (Signed)
Pt ambulated to restroom without difficulty. Pt will be inpatient.

## 2017-07-26 NOTE — ED Notes (Signed)
Patient having TTS done at this time. 

## 2017-07-26 NOTE — ED Notes (Signed)
Pt ambulated to room, calm and cooperative. Will call telepsych for assessment.

## 2017-07-26 NOTE — ED Notes (Signed)
Continues to be agitated yelling out and trying to run.  Physician notified.  Medicated once again with geodon.  Taking clothes off trying to come out into hall.

## 2017-07-26 NOTE — Progress Notes (Signed)
Pt chart reviewed.  Pt meets inpatient criteria per Shuvon Rankin.  [facility].  Patient referrals sent to the following:  [facility]     [facility]  [facility]  [facility]  [facility]  [facility]  [facility]  [facility]  [facility]  [facility]  [facility]  [facility]  [facility]  [facility]  [facility]        Disposition CSW's will continue to follow for placement.  Timmothy EulerJean T. Kaylyn LimSutter, MSW, LCSWA Disposition Clinical Social Work [phone] (cell) [phone] (office)
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
## 2017-07-26 NOTE — ED Notes (Signed)
Ran out in hallway trying to leave.  Bed changed.  Assisted back to bed.
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
## 2017-07-26 NOTE — Consult Note (Signed)
    Teresa PeachLucia Kane, 32 y.o., [facility] with complaints of manic and aggressive behavior, confusion, and some delusional thinking.  After reviewing chart and medications noted that patient has had two other ED visits with similar behavior and presentation; last visit 10/2046.  Patient has denies drug use.  Last visit patient was treated with Saphris 5 mg bid and became stable enough for discharge and outpatient services.  Will restart Saphris 5 mg today.  Recommending inpatient psychiatric treatment but if patient becomes stabilized psychiatrically can be discharged for outpatient services.    Teresa Redmann B. Halsey Persaud,NP

## 2017-07-26 NOTE — BHH Counselor (Signed)
Spoke with pt's RN, Wyn ForsterMadison and he reported the pt was given Geodon and is still sedated.  TTS informed RN her assessment will be completed at a later time when she is alert.
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
## 2017-07-26 NOTE — BH Assessment (Signed)
BHH Assessment Progress Note  Pt recommended for IP psychiatric treatment, at this time, after review with Assunta FoundShuvon Rankin, NP. Shuvon put in an order to start pt on Saphris 5 mg BID, as she was given this at her last ED visit for similar symptoms in 10/2016. If pt is not placed by tomorrow, she will be reassessed for stability and safety for possible d/c. Tobi BastosAnna, RN, notified.    Johny ShockSamantha M. Ladona Ridgelaylor, MS, NCC, LPCA Counselor

## 2017-07-26 NOTE — ED Notes (Signed)
ED Provider at bedside. 

## 2017-07-26 NOTE — ED Notes (Signed)
Regular Diet has been ordered for Dinner. 

## 2017-07-26 NOTE — ED Notes (Signed)
Walking in hall.  Trying to hug sitter.  When asked to return to room swung at sitter.
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
## 2017-07-26 NOTE — ED Notes (Signed)
Continues to kick feet against the mattress.  Will not answer questions when spoken to.  No injury noted.

## 2017-07-26 NOTE — ED Notes (Signed)
Meal tray ordered for the patient 

## 2017-07-26 NOTE — ED Notes (Signed)
Patient walked to the restroom and attempted to disrobe and walk back to her room, patient redirected by sitter and went back into the bathroom. Patient ambulatory back to room fully robed.

## 2017-07-26 NOTE — ED Notes (Signed)
Patient lying in bed slamming feet onto the foot of the bed.  When asked if she is okay or needs anything will not answer just stares ahead.  To continue to monitor closely.

## 2017-07-26 NOTE — ED Notes (Signed)
Sitting on the side of the bed eating at this time.

## 2017-07-26 NOTE — ED Notes (Signed)
Pt being aggressive. Pt denied blood draw.

## 2017-07-26 NOTE — ED Notes (Signed)
Teleassessment informed pt is awake and cooperative.

## 2017-07-26 NOTE — ED Notes (Signed)
The patient is continually attempting to go in the other patients' rooms and ask them why they are here and is taking multiple attempts by staff to get her to go back to her room.

## 2017-07-26 NOTE — ED Notes (Signed)
The patient went into the bathroom with 3 security guards standing at the desk. She then came out of the bathroom with her pants down and genitals exposed. Instructed to pull her pants back up and directed to her room.

## 2017-07-26 NOTE — ED Notes (Signed)
IVC paperwork has been served. [facility] copy placed in medical records. 3 Copys on pt clipboard.

## 2017-07-26 NOTE — ED Notes (Signed)
Patient was Given Drink and Snack.

## 2017-07-26 NOTE — ED Notes (Signed)
Pt ambulate to bathroom 

## 2017-07-26 NOTE — ED Notes (Signed)
The patient has used her 1st call for the day and is aware of 2 call per day policy.

## 2017-07-27 NOTE — Progress Notes (Signed)
**  Patient is non-complaint right now. Trying to re-direct patient, called nurse to get to help with re-direct. Talking with other patient in the room.

## 2017-07-27 NOTE — ED Notes (Signed)
Two female visitors visiting pt. Brought pt's eyeglasses in black eyeglass case - given to pt. Brought pt clothing - advised will need to take back w/them d/t pt wears paper scrubs in ED. Advised may bring them back to ED if pt is d/c'd or may take to accepting facility if pt is accepted. Voiced understanding and agreement.

## 2017-07-27 NOTE — ED Notes (Signed)
Pt ambulatory to shower. Sitter w/pt. 

## 2017-07-27 NOTE — B[facility] Counselor (Signed)
Reassessment Note: Pt is a poor historian and does not remember why she is here. Pt seems somewhat confused and is laying in the bed with just a sheet on and no clothes. Pt states that she does not take medications for mental health issues. Pt came in manic but denies history of this. Pt was seen a year ago with a similar presentation where she was diagnosed with Bipolar Disorder. Pt denies HI SI and AVH at this time and denies drug or alcohol use. UDS is negative. Pt continues to meet inpatient criteria due to manic episode.   [address]Teresa Kane Charter OakLPC, LCAS

## 2017-07-27 NOTE — B[facility] Counselor (Signed)
Reassessment Note: Pt sleeping. RN will call when pt is awake and alert.   [address]Jasani Lengel MarbletonLPC, LCAS

## 2017-07-27 NOTE — ED Notes (Signed)
Pt given water cup

## 2017-07-27 NOTE — ED Notes (Signed)
Pt ambulated to bathroom when another pt's visitor was exiting bathroom. Pt went into bathroom to wash her hands which could have been performed in her room. Staff encouraged pt to return to room d/t pt attempted to stand in hallway and talk w/other pt's and visitors x 2.

## 2017-07-27 NOTE — ED Notes (Addendum)
Pt's sister took all of pt's belongings - 2 labeled belongings bags - NO Valuables noted for pt in ED - per pt's request. Pt only has her eyeglasses.

## 2017-07-27 NOTE — ED Notes (Signed)
Pt's sister stated she feels pt is OK to be d/c'd to home. Advised pt and sister pt will be re-evaluated in AM. Pt states "I will go anywhere you want me to go but only if you're paying for it". Sister states they are concerned w/cost. Advised pt and sister safety of pt comes first. Voiced understanding of tx plan.

## 2017-07-27 NOTE — ED Notes (Signed)
Restraints removed at this time as a trial.  So far calm and cooperative.
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
## 2017-07-27 NOTE — ED Notes (Signed)
Pt given phone to call family.

## 2017-07-27 NOTE — ED Notes (Signed)
Pt's family visiting. Pt voiced understanding and agreement prior to their visit of that she is expected to continue to display appropriate behavior.

## 2017-07-27 NOTE — Progress Notes (Signed)
Spoke with [facility] intake- pt's referral still being reviewed for waiting list.  Ilean SkillMeghan Roby Donaway, MSW, LCSW Clinical Social Work 07/27/2017 [phone]

## 2017-07-27 NOTE — ED Notes (Signed)
Pt noted to be confused - holding onto paper from breakfast tray and requested to keep paper from armband that was re-printed and placed on pt's wrist after verifying pt's name and DOB d/t pt's was on counter in room. Initially denies taking Saphris then states she does take it. [place] - denies any other meds. Initially denies drug use then states "I smoke marijuana" then stated "Just kidding. No, I don't". Pt appears anxious, noted w/hands shaking. States drinks ETOH occasionally - last was at [employer]. Denies drinking daily, binge drinking, or hx ETOH withdrawals symptoms. Pt's scrubs noted at bedside - pt wearing no clothing and is covered by bed sheet. RN encouraged pt to put on scrubs. States she will later.

## 2017-07-27 NOTE — ED Notes (Signed)
Continues to rest calmly since releasing restraints.  Reports feeling better this morning.  No distress noted at this time.

## 2017-07-27 NOTE — ED Notes (Signed)
Pt given Medical Clearance Pt Policy form - voiced understanding. Copy given to pt.

## 2017-07-27 NOTE — ED Notes (Addendum)
Added pt's loop-like earring (1) to pt's belongings - gray/silver in color. Placed in labeled clear plastic bag then placed inside pt's belongings bag w/pt's other earring noted to be in specimen cup. Pt's clothing in labeled belongings bags noted to be soiled w/urine. Pt states she is missing her eyeglasses. None noted in pt's belongings bags - pt aware. Not noted in pt's sheets/blankets either.

## 2017-07-28 NOTE — ED Notes (Signed)
Pt ambulated into hallway - approached RN and placed her hand on RN's right shoulder and back - attempting to reach water cups on RN's WOW - stating "I want to show you how to carry those waters". [employer] Officer assisted. Advised pt inappropriate to be touching staff w/o asking. Pt returned to room after much encouragement.

## 2017-07-28 NOTE — ED Notes (Signed)
Pt noted to be clapping loudly d/t hears another pt clapping. Pt stopped after much encouragement. Pt then asking "Do you know when I'm going to leave? I'll go anywhere but you guys have to pay for it". Advised pt will advise her when she has been accepted to a facility.

## 2017-07-28 NOTE — BH Assessment (Signed)
[facility]  Clinician reassessed pt this morning. Pt appeared to pretend to be able to hear clinician. Nurse in room was able to hear clinician clearly. She was able to respond to the nurse with no issues. She states "I'll stay here for 3 or 4 week as long as you pay for it." She states she is doing great and she slept well. During assessment, pt appeared to be labile and tangential. She states she does not like taking medications unless they are natural. RN states pt had to be moved rooms due to making inappropriate comments to another patient. Pt continues to meet criteria for inpatient admission. TTS will continue to seek placement.   Teresa FloroCandace L Trystyn Kane MSW, LCSW  07/28/2017 9:39 AM

## 2017-07-28 NOTE — ED Notes (Signed)
Pt to shower. Cooperative.

## 2017-07-28 NOTE — ED Notes (Signed)
Pt continues to talk to International Business MachinesSitter.

## 2017-07-28 NOTE — ED Notes (Addendum)
Moved pt to F6 d/t pt being inappropriate w/another female pt. Pt is speaking w/him in Spanish and making inappropriate gestures. Female pt had asked for water and this pt took her cup of water from her room and gave to him. RN removed cup and threw it away. Advised pt this is inappropriate. Pt attempted to be argumentative - stating "Well, he asked for it and you didn't give it to him". Pt attempted to continue to speaking w/pt from her room. Pt attempted to ambulate to bathroom near other's pt's room. RN advised pt she may use bathroom that is beside of her room. Pt voiced understanding.

## 2017-07-28 NOTE — ED Notes (Signed)
Pt eating dinner. Pt continues to talk excessively.

## 2017-07-28 NOTE — ED Notes (Signed)
Pt sitting in floor in doorway of room. Another pt began clapping, this pt attempting to see pt from her room and began clapping. Pt stopped after much encouragement.

## 2017-07-28 NOTE — ED Notes (Signed)
Re-TTS completed. Pt initially was saying that she could not hear the Counselor via TTS. When pt was advised Inpt Tx is being sought, pt then began talking w/Counselor. Pt on phone at nurses' desk - crying - asking for the person to take care of her son.

## 2017-07-28 NOTE — ED Notes (Signed)
Sisters x 2 visiting w/pt.

## 2017-07-28 NOTE — ED Notes (Signed)
Pt is aware inappropriate to approach other pt's and family members - pt continues to attempt to do so. Pt approached another pt's spouse - speaking to her in Spanish and shaking her hand- as spouse was leaving. Pt also attempted to hug her. RN spoke w/pt and pt returned to her room after much encouragement. Pt had told Sitter she is allowed to ambulate in hallway. RN advised pt and Sitter pt is to ambulate from her room to the bathroom only. Voiced understanding.

## 2017-07-28 NOTE — ED Notes (Signed)
Breakfast at pt's bedside 

## 2017-07-28 NOTE — ED Notes (Signed)
Pt talking w/Sitter.  

## 2017-07-28 NOTE — ED Notes (Signed)
Female and female traded out w/pt's sisters - visiting w/pt.

## 2017-07-28 NOTE — ED Notes (Signed)
Pt noted to be adjusted bed up and down - states "I like the noise, don't you?" for approx 10 min.

## 2017-07-28 NOTE — ED Notes (Signed)
Pt tearful in room. Cooperative. States "I just miss my son". Water given. Does not want snack at this time.

## 2017-07-28 NOTE — Progress Notes (Signed)
Patient was given magazines upon request.

## 2017-07-28 NOTE — ED Notes (Signed)
Pt has not slept thus far this shift. Pt noted to be watching tv and talking to [place] Hill[place]itter continuously.

## 2017-07-28 NOTE — ED Notes (Signed)
Ordered Breakfast Tray  

## 2017-07-28 NOTE — ED Notes (Signed)
Pt's mother, boyfriend, and son are visiting w/pt.

## 2017-07-29 NOTE — BHH Counselor (Signed)
Reassessment- Pt denies SI/HI and AVH. Pt states she does not remember what brought her to the ED. Pt states she is unaware of any changes in her behavior.  Per NP collateral contact needed. This writer attempted to contact the Pt's mother Anantasia at [phone] but was unsuccessful. This Clinical research associatewriter also attempted to contact the Pt's sister Trula OreChristina at [phone] and was also unsuccessful. Both numbers do not have a voicemail set-up.  Wolfgang PhoenixBrandi Santanna Whitford, [facility] Triage Specialist

## 2017-07-29 NOTE — ED Notes (Signed)
Evening meal ordered.

## 2017-07-29 NOTE — ED Notes (Signed)
Breakfast tray ordered 

## 2017-07-29 NOTE — ED Notes (Signed)
Per patient she quit taking her psych meds several months ago because she couldn't afford them. States Saphris prescription cost 400 or 600 dollars per month and patient does not have insurance. Instructed patient to please discuss this with her Doctors and social workers. Will attempt to contact them about same.

## 2017-07-29 NOTE — ED Notes (Signed)
Family at bedside. Questioned AD cline if 32 year old should be visitor in Psych area. States no clear  34 year old son allowed to speak with mom and say good by.  Will clarify with Director for minor visitors.

## 2017-07-29 NOTE — Discharge Planning (Signed)
[facility] needing medication assistance and  PCP.  EDCM advised pt to contact [facility] for PCP establishment and [facility] or DSS for financial counseling.   [facility] an explained that medications will be $3 per prescription.  EDCM explained that this assistance is granted once and stressed the need for pt to make appointment with financial counselors.  No further EDCM needs identified at this time.  Mars Scheaffer J. Lucretia RoersWood, RN, BSN, [place]CM [phone]

## 2017-07-29 NOTE — Progress Notes (Signed)
CSW was informed by RN that pt has stopped taking medications due to the cost of these medications. CSW informed RN that she would speak with RNCM to see about options to help afford medication. CSW has spoken with RNCM and made RNCM aware of pt's need at this time. CSW signing off as no further CSW intervention is needed.     Claude MangesKierra S. Luby Seamans, MSW, LCSW-A Emergency Department Clinical Social Worker [phone]

## 2017-07-29 NOTE — B[facility] Counselor (Signed)
Nurse Aldean JewettMillie, RN, called TTS to provide an updated number for patient's sister Trula OreChristina [phone].

## 2017-07-30 ENCOUNTER — Other Ambulatory Visit: Payer: Self-pay

## 2017-07-30 NOTE — ED Notes (Signed)
Watching tv

## 2017-07-30 NOTE — ED Notes (Signed)
Regular Diet Ordered for Dinner. 

## 2017-07-30 NOTE — BH Assessment (Signed)
[facility] Assessment Progress Note  Pt reassessed this morning. Pt denies SI, HI, AVH. Pt lacks insight into her mental health situation. She continues to be slightly tangential. IP treatment still recommended for pt.   Johny ShockSamantha M. Ladona Ridgelaylor, MS, NCC, LPCA Counselor

## 2017-07-30 NOTE — ED Notes (Signed)
Pt on phone w/her son.

## 2017-07-30 NOTE — ED Notes (Signed)
Pt stated the RN told her son while he was visiting yesterday that he had to leave as children are not permitted to visit. Pt noted to be tearful when she was explaining situation. Advised pt there was miscommunication and that her son may visit.

## 2017-07-30 NOTE — ED Notes (Signed)
Pt is requesting to have a Face to Face talk with Psychiatric instead of the tele psych consult. Pt states she has some question that will like to ask on a face to face way.

## 2017-07-30 NOTE — ED Notes (Signed)
Pt given copy of form to Consent to Release Info form as requested. States she wants her sister to be present when she signs it d/t wants her sister to have information about her.

## 2017-07-30 NOTE — ED Notes (Signed)
Pt talking w/staff. 

## 2017-07-30 NOTE — ED Notes (Signed)
Mother visiting w/pt. 

## 2017-07-30 NOTE — ED Notes (Signed)
Pt's sister brought in Healthcare POA paperwork for pt to sign. States they lost the other form. Advised her, per pt's request, pt is not able to sign the paperwork at this time d/t IVC'd. After much discussion and including Consulting civil engineerCharge RN, pt and sister voiced understanding and will look for pt's other paperwork.

## 2017-07-30 NOTE — ED Notes (Signed)
Pt ambulatory to shower w/NT. 

## 2017-07-31 MED ORDER — ASENAPINE MALEATE 5 MG SL SUBL
10.0000 mg | SUBLINGUAL_TABLET | Freq: Two times a day (BID) | SUBLINGUAL | Status: DC
  Administered 2017-07-31 – 2017-08-01 (×3): 10 mg via SUBLINGUAL
  Filled 2017-07-31 (×5): qty 2

## 2017-07-31 NOTE — ED Notes (Signed)
Sister requested that someone stay in pt's room to ensure pt swallows meds.

## 2017-07-31 NOTE — ED Notes (Signed)
[facility] notified of pt's ongoing manic behavior and speech-

## 2017-07-31 NOTE — ED Notes (Signed)
Pt has been talking nonstop to sitter and any visitors--

## 2017-07-31 NOTE — ED Notes (Signed)
Patient was Given a snack and a Regular Diet was ordered for Lunch.

## 2017-07-31 NOTE — ED Notes (Signed)
Pt was walking into rm 10 to see other pt-- instructed pt to go back to room.

## 2017-07-31 NOTE — ED Notes (Signed)
Visitors at bedside- pt signed release of information for brother and sister to be able to obtain information regarding condition.

## 2017-07-31 NOTE — ED Notes (Signed)
ORDERED DIET TRAY FOR PT  

## 2017-07-31 NOTE — BHH Counselor (Signed)
Re-assessment:   Patient report she feels fine. Patient speaking in rapid speech. Patient unable to express what brought her to the hospital, she reported what her mother and sister told her why she was in the hospital. Patient drafted from the conversation, speaking in tangential language. Patient spoke non-stop speaking in tangential language.    Patient denies SI, HI, and AVH.   [facility]

## 2017-08-01 MED ORDER — ACETAMINOPHEN 325 MG PO TABS: 325.0000 mg | ORAL_TABLET | Freq: Once | ORAL | Status: DC

## 2017-08-01 MED ORDER — ACETAMINOPHEN 325 MG PO TABS
650.0000 mg | ORAL_TABLET | Freq: Once | ORAL | Status: AC
  Administered 2017-08-01: 650 mg via ORAL
  Filled 2017-08-01: qty 2

## 2017-08-01 NOTE — ED Notes (Signed)
Patient having TTS done at this time. 

## 2017-08-01 NOTE — ED Notes (Signed)
Pt up to BR and TTS being done

## 2017-08-01 NOTE — ED Notes (Addendum)
Patient states she feels lightheaded after taking her medication this morning. She states it is due to the medication she received last night and this morning. She reports she feels it is too high of a dose and is making her feel this way. Will consult pharmacist and MD.

## 2017-08-01 NOTE — ED Notes (Signed)
[facility] patient's medication and dosage (pt c/o lightheadedness). Patient updated on plan of care, denies questions.

## 2017-08-01 NOTE — ED Notes (Signed)
Regular Diet was ordered for Lunch, Patient didn't want a snack at 10am.

## 2017-08-01 NOTE — ED Notes (Signed)
Pt. Has been up and walking in hall to restroom. Very cooperative and stable. Had a phone call from her sister. Excepted call and was seen to be happy and glad to speak with her.
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
## 2017-08-01 NOTE — ED Notes (Signed)
Pt finally lying quietly, awake for the past hour, talking non-stop w/ sitters.  Sitter continued to redirect her to rest and not become excited.

## 2017-08-01 NOTE — ED Notes (Signed)
Pt displayed manic behaviors, wanting to ask many repeatative questions regarding her care.  RN reviewed her care again as had been done earlier in the day. RN acknowledged pt's frustration w/ the amount of time it is taking to get treatment.  Pt continues to verbalize no need for treatment, she will "take my medication at home when I need it."

## 2017-08-02 ENCOUNTER — Encounter (HOSPITAL_COMMUNITY): Payer: Self-pay | Admitting: Registered Nurse

## 2017-08-02 DIAGNOSIS — F312 Bipolar disorder, current episode manic severe with psychotic features: Secondary | ICD-10-CM

## 2017-08-02 DIAGNOSIS — Z818 Family history of other mental and behavioral disorders: Secondary | ICD-10-CM

## 2017-08-02 DIAGNOSIS — Z87891 Personal history of nicotine dependence: Secondary | ICD-10-CM

## 2017-08-02 MED ORDER — ASENAPINE MALEATE 10 MG SL SUBL: 10.0000 mg | SUBLINGUAL_TABLET | Freq: Every day | SUBLINGUAL | 0 refills | Status: DC

## 2017-08-02 MED ORDER — ASENAPINE MALEATE 5 MG SL SUBL: 10.0000 mg | SUBLINGUAL_TABLET | Freq: Every day | SUBLINGUAL | Status: DC

## 2017-08-02 NOTE — ED Notes (Signed)
Papers re-faxed to magistrate. Confirmation they received them verbally, and paper.

## 2017-08-02 NOTE — ED Notes (Signed)
Pt was complaining of right knee pain. Informed MD.  Was told verbal to put in order for 650 tylenol.

## 2017-08-02 NOTE — Consult Note (Signed)
Telepsych Consultation   Reason for Consult:  Manic and aggressive behavior Referring Physician:  Norman Clay Location of Patient: [facility] Location of Provider: [facility]  Patient Identification: Teresa Kane MRN:  [id number] Principal Diagnosis: Bipolar affective disorder, current episode manic with psychotic symptoms [facility]) Diagnosis:   Patient Active Problem List   Diagnosis Date Noted  . Bipolar affective disorder, current episode manic with psychotic symptoms (HCC) [F31.2] 10/22/2016    Total Time spent with patient: 45 minutes  Subjective:   Stepfanie Kane is a 32 y.o. female patient to [facility] 07/26/17 under IVC by her family with complaints of manic and aggressive behavior, confusion, and some delusional thinking.  Patient chart and medications were reviewed by this provider 07/26/17 and was started on Saphris 5 mg bid for mood stabilization and inpatient psychiatric treatment was recommended.  Chart and medications reviewed again on 07/31/17 by this provider and Saphris was increased to 10 mg bid.  Patient has been referred to multiple inpatient facilities and declined; patient continues to be on  [facility] wait list.  Will reassess patient via telepsych today to assess if patient is at baseline and stabilized enough for discharge since she has been tolerating medication and on going treatment while she was in the ED.     HPI:  Teresa Kane, 32 y.o., female patient seen via telepsych by this provider; chart reviewed and consulted with Dr. Lucianne Muss on 08/02/17.  After reviewing chart; nursing notes states that patient has been calm/cooperative but has complained of dizziness since the increase of Saphris.  On evaluation [name] reports that she is feeling better. Patient states that she is getting dizzy with the medication since it has been increased.  "The last time I was taking it I was just taking one pill and it did fine.  I'm not getting sleepy or tired; it is making  me dizzy and I don't want to take it if I'm dizzy cause I can't work. Discussed taking 10 mg at bed time and following up with outpatient psychiatric provider for medication management if adjustments or changes need to be done.  Patient voiced understanding and agreed.  Patient denies suicidal/homicidal/self-harm ideation, psychosis, and paranoia.     During evaluation [name] is alert/oriented x 4; calm/cooperative with pleasant affect.  She does not appear to be responding to internal/external stimuli.  Patient denies suicidal/self-harm/homicidal ideation, psychosis, and paranoia.  Patient answered all question appropriately.  Patient verbalized th importance of continuing medication and following up with outpatient services for medication management and assistance.   Patient psychiatrically cleared.     Past Psychiatric History: Bipolar affective disorder  Risk to Self: Suicidal Ideation: No Suicidal Intent: No Is patient at risk for suicide?: No Suicidal Plan?: No Access to Means: No Intentional Self Injurious Behavior: None Risk to Others: Homicidal Ideation: No Thoughts of Harm to Others: No Current Homicidal Intent: No Current Homicidal Plan: No Access to Homicidal Means: No History of harm to others?: Yes Assessment of Violence: On admission Violent Behavior Description: see narrative Does patient have access to weapons?: No Criminal Charges Pending?: No Does patient have a court date: No Prior Inpatient Therapy: Prior Inpatient Therapy: No Prior Outpatient Therapy: Prior Outpatient Therapy: No Does patient have an ACCT team?: No Does patient have Intensive In-House Services?  : No Does patient have [facility] services? : No Does patient have P4CC services?: No  Past Medical History:  Past Medical History:  Diagnosis Date  . Anxiety   .  Bipolar affective disorder (HCC)    History reviewed. No pertinent surgical history. Family History:  Family History  Problem Relation  Age of Onset  . Schizophrenia Mother   . Schizophrenia Other    Family Psychiatric  History: See above for family history Social History:  Social History   Substance and Sexual Activity  Alcohol Use No     Social History   Substance and Sexual Activity  Drug Use No    Social History   Socioeconomic History  . Marital status: Single    Spouse name: None  . Number of children: None  . Years of education: None  . Highest education level: None  Social Needs  . Financial resource strain: None  . Food insecurity - worry: None  . Food insecurity - inability: None  . Transportation needs - medical: None  . Transportation needs - non-medical: None  Occupational History  . None  Tobacco Use  . Smoking status: Former Games developermoker  . Smokeless tobacco: Never Used  Substance and Sexual Activity  . Alcohol use: No  . Drug use: No  . Sexual activity: Yes  Other Topics Concern  . None  Social History Narrative  . None   Additional Social History:    Allergies:   Allergies  Allergen Reactions  . Aspirin Other (See Comments)    Patient stated that she was once told she was allergic, but doesn't recall the reaction    Labs: No results found for this or any previous visit (from the past 48 hour(s)).  Medications:  Current Facility-Administered Medications  Medication Dose Route Frequency Provider Last Rate Last Dose  . asenapine (SAPHRIS) sublingual tablet 10 mg  10 mg Sublingual BID Stone Spirito B, NP   10 mg at 08/01/17 2326   Current Outpatient Medications  Medication Sig Dispense Refill  . Misc Natural Products (JOINT HEALTH) CAPS Take 1 capsule by mouth daily.    Marland Kitchen. asenapine (SAPHRIS) 5 MG SUBL 24 hr tablet Place 1 tablet (5 mg total) under the tongue at bedtime. (Patient not taking: Reported on 07/26/2017) 30 tablet 0    Musculoskeletal: Strength & Muscle Tone: within normal limits Gait & Station: normal Patient leans: N/A  Psychiatric Specialty Exam: Physical  Exam  ROS  Blood pressure 110/80, pulse 83, temperature 98.1 F (36.7 C), temperature source Oral, resp. rate 16, SpO2 100 %.There is no height or weight on file to calculate BMI.  General Appearance: Casual  Eye Contact:  Good  Speech:  Clear and Coherent and Normal Rate  Volume:  Normal  Mood:  Appropriate  Affect:  Appropriate  Thought Process:  Coherent and Goal Directed  Orientation:  Full (Time, Place, and Person)  Thought Content:  Logical  Suicidal Thoughts:  No  Homicidal Thoughts:  No  Memory:  Immediate;   Good Recent;   Good Remote;   Good  Judgement:  Intact  Insight:  Present  Psychomotor Activity:  Normal  Concentration:  Concentration: Good and Attention Span: Good  Recall:  Good  Fund of Knowledge:  Fair  Language:  Good  Akathisia:  No  Handed:  Right  AIMS (if indicated):     Assets:  Communication Skills Desire for Improvement Housing Social Support  ADL's:  Intact  Cognition:  WNL  Sleep:        Treatment Plan Summary: Plan Psychiatrically cleared; Decrease Saphris to 10 mg Q hs.  Disposition: No evidence of imminent risk to self or others at present.  Supportive therapy provided about ongoing stressors. Discussed crisis plan, support from social network, calling 911, coming to the Emergency Department, and calling Suicide Hotline. Patient does not meet criteria for inpatient psychiatric treatment related to stabilization  EDP to give prescription for Saphris 10 mg Q hs 2 week supply until patient is able to meet with outpatient psychiatric provider for medication management.     This service was provided via telemedicine using a 2-way, interactive audio and video technology.  Names of all persons participating in this telemedicine service and their role in this encounter. Name: Assunta Found NP Role: Telepsych  Name: Dr Lucianne Muss Role: Psychiatrist  Name: Cardell Peach Role: Patient  Name:  Role:     Assunta Found, NP 08/02/2017 9:14  AM

## 2017-08-02 NOTE — Discharge Planning (Signed)
[facility] and presented to patient.  Pt understands that this is a one time enrollment and she will have to apply for insurance/medication assistance for future needs.  No further EDCM needs identified at this time.  Teresa Kane J. Lucretia RoersWood, RN, BSN, [place] [phone]

## 2017-08-02 NOTE — ED Notes (Signed)
Case manager talking with patient about medications, and belongings returned to patient.

## 2017-08-02 NOTE — ED Provider Notes (Signed)
Received care of patient at 9 AM.  Briefly, this is a 32 year old female who has been holding due to behavioral problems with history of bipolar.  While she was initially agitated, requiring restraints and sedation's, at this time after receiving medications she is cooperative and calm.  TTS had reevaluated her again this morning, and felt she was appropriate for outpatient management.  She denies suicidal, homicidal ideations or hallucinations.  She understands follow-up instructions, and instructions to take Saphris.  Given prescription for 14 days, will follow-up as an outpatient.   Alvira MondaySchlossman, Srinidhi Landers, MD 08/04/17 [phone]

## 2017-08-02 NOTE — ED Notes (Signed)
TTS calling reports pt is cleared for d/c. [place] extended to 7 days. Will start rescinding the papers, paper filled out, and will fax to clerk of court.

## 2017-08-02 NOTE — ED Notes (Signed)
Pt ambulatory to d/c. Took all belongings with her.

## 2017-08-02 NOTE — ED Notes (Signed)
Resources given to patient for medications. She doesn't have any insurance and they are several hundred dollars. Social work is going to come talk with patient to make sure she knows where to go to get her medications.

## 2017-08-02 NOTE — ED Notes (Signed)
IVC paper work rescinding form faxed to clerk of court [phone]. Fax confirmation printed.

## 2017-08-02 NOTE — ED Notes (Signed)
Pt completed TTS evaluation 

## 2017-08-02 NOTE — ED Notes (Signed)
Dr. Eudelia Bunchardama at bedside to fill out new IVC paperwork. Secretary has faxed to [place] In[place]istrate

## 2017-08-02 NOTE — ED Notes (Signed)
Dr Eudelia Bunchardama was given new paper work to fill out for this patient to continue her IVC.

## 2017-08-02 NOTE — ED Notes (Signed)
Paperwork filled out to rescind IVC papers, given to Dr. Dalene SeltzerSchlossman. She will come see the patient. Then RN will fax.

## 2017-08-02 NOTE — Discharge Planning (Signed)
EDCM has completed MATCH card in EDCM/SW office.  Will update and present to pt at discharge if pt disposition is home.  Deneane Stifter J. Lucretia RoersWood, RN, BSN, [place]CM [phone]

## 2021-11-06 ENCOUNTER — Encounter: Payer: Self-pay | Admitting: Medical

## 2021-11-23 NOTE — Discharge Summary (Signed)
" °  OB Post Partum Discharge Summary   Patient ID: Teresa Kane [id number] 36 y.o. August 31, 1985  Admit date:  11/22/2021  Discharge date:    Attending Physician:  Victory Conrad Buss, MD  Admission diagnosis:  1. [redacted]w[redacted]d pregnant 2. Active Labor  Discharge diagnosis:  1. S/p normal spontaneous vaginal delivery  Discharged Condition:  Stable  Indication for Admission:  Postpartum care  Procedures:  SVD   Hospital Course:  Normal   Discharge Instructions:   Discharge Orders and Instructions    Diet At Discharge   Complete by: As directed    Recommended diet at discharge: Regular diet   Activity At Discharge   Complete by: As directed    Recommended activity at discharge: Activity as tolerated   No strenuous exercise for (weeks): 4   No sex for (weeks): 6   Contact Us  / Call Us  To Schedule An Appointment   Complete by: As directed    Call: Dr Buss   Call if: Increased bleeding   Call if: Increased pain      DISCHARGE MEDICATIONS:   .    START taking these medications   ibuprofen  600 MG tablet Commonly known as: ADVIL ,MOTRIN  Dose: 600 mg Instructions: Take 1 tablet (600 mg total) by mouth every 6 (six) hours.     CONTINUE taking these medications   Contour Next Test Strips Strp test strips Dose: 1 strip Instructions: 1 strip by Misc.(Non-Drug; Combo Route) route 4 times daily for 28 days. Generic drug: blood sugar diagnostic     STOP taking these medications   clonazePAM 0.5 MG tablet Commonly known as: KlonoPIN         Activity:  as tolerated  Diet:  regular   Follow Up:   Future Appointments      Provider Department Dept Phone Center   01/03/2022 1:55 PM Don Leisa [facility] - [address] [phone] [place]        Signed: [facility]    (Time spent on discharge:  NA )     Electronically signed by: Victory Conrad Buss, MD 11/23/21 0900  "

## 2022-10-30 ENCOUNTER — Ambulatory Visit: Payer: Self-pay | Admitting: Internal Medicine

## 2024-07-21 ENCOUNTER — Ambulatory Visit
Admission: EM | Admit: 2024-07-21 | Discharge: 2024-07-21 | Disposition: A | Discharge status: Home / Self Care | Payer: Self-pay | Attending: Family Medicine | Admitting: Family Medicine

## 2024-07-21 ENCOUNTER — Ambulatory Visit (HOSPITAL_COMMUNITY)
Admission: EM | Admit: 2024-07-21 | Discharge: 2024-07-21 | Disposition: A | Discharge status: Home / Self Care | Attending: Psychiatry | Admitting: Psychiatry

## 2024-07-21 ENCOUNTER — Encounter: Payer: Self-pay | Admitting: Emergency Medicine

## 2024-07-21 DIAGNOSIS — Z818 Family history of other mental and behavioral disorders: Secondary | ICD-10-CM | POA: Insufficient documentation

## 2024-07-21 DIAGNOSIS — F411 Generalized anxiety disorder: Secondary | ICD-10-CM | POA: Insufficient documentation

## 2024-07-21 DIAGNOSIS — R42 Dizziness and giddiness: Secondary | ICD-10-CM | POA: Insufficient documentation

## 2024-07-21 DIAGNOSIS — Z79899 Other long term (current) drug therapy: Secondary | ICD-10-CM | POA: Insufficient documentation

## 2024-07-21 DIAGNOSIS — F209 Schizophrenia, unspecified: Secondary | ICD-10-CM

## 2024-07-21 DIAGNOSIS — F202 Catatonic schizophrenia: Secondary | ICD-10-CM | POA: Insufficient documentation

## 2024-07-21 MED ORDER — RISPERIDONE 1 MG PO TABS: 2.0000 mg | ORAL_TABLET | Freq: Two times a day (BID) | ORAL | 0 refills | Status: DC

## 2024-07-21 MED ORDER — ESCITALOPRAM OXALATE 10 MG PO TABS: 10.0000 mg | ORAL_TABLET | Freq: Every day | ORAL | 0 refills | Status: DC
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
## 2024-07-21 NOTE — ED Provider Notes (Signed)
 " [facility]    CSN: [id number] Arrival date & time: 07/21/24  1431      History   Chief Complaint Chief Complaint  Patient presents with   Headache   Dizziness    HPI Teresa Kane is a 39 y.o. female presents for dizziness.  Patient reports 2 days intermittent dizziness.  Also intake note stated that she had headaches but when asked she denies headaches.  States she has had similar symptoms in the past and she was diagnosed with schizophrenia.  She has been off of her medication for at least a year has not seen her psychiatrist in about 6 months.  She states she typically gets dizzy and has sleeping difficulty when things act up.  She is reporting insomnia.  No nausea vomiting syncope chest pain shortness of breath neck pain or ear pain.  She is wanting to restart her Lexapro .   Headache Associated symptoms: dizziness   Dizziness   Past Medical History:  Diagnosis Date   Anxiety    Bipolar affective disorder [facility])     Patient Active Problem List   Diagnosis Date Noted   Bipolar affective disorder, current episode manic with psychotic symptoms (HCC) 10/22/2016    History reviewed. No pertinent surgical history.  OB History   No obstetric history on file.      Home Medications    Prior to Admission medications  Medication Sig Start Date End Date Taking? Authorizing Provider  escitalopram  (LEXAPRO ) 10 MG tablet 1 tablet Orally Once a day 10/09/23   [provider]    Family History Family History  Problem Relation Age of Onset   Schizophrenia Mother    Schizophrenia Other     Social History Social History[1]   Allergies   Aspirin   Review of Systems Review of Systems  Neurological:  Positive for dizziness.  Psychiatric/Behavioral:  Positive for sleep disturbance.      Physical Exam Triage Vital Signs ED Triage Vitals [07/21/24 1610]  Encounter Vitals Group     BP 126/85     Girls Systolic BP Percentile      Girls  Diastolic BP Percentile      Boys Systolic BP Percentile      Boys Diastolic BP Percentile      Pulse Rate 92     Resp 17     Temp 98.3 F (36.8 C)     Temp Source Oral     SpO2 96 %     Weight      Height      Head Circumference      Peak Flow      Pain Score 6     Pain Loc      Pain Education      Exclude from Growth Chart    No data found.  Updated Vital Signs BP 126/85 (BP Location: Left Arm)   Pulse 92   Temp 98.3 F (36.8 C) (Oral)   Resp 17   LMP 07/01/2024 (Exact Date)   SpO2 96%   Visual Acuity Right Eye Distance:   Left Eye Distance:   Bilateral Distance:    Right Eye Near:   Left Eye Near:    Bilateral Near:     Physical Exam Vitals and nursing note reviewed.  Constitutional:      General: She is not in acute distress.    Appearance: Normal appearance. She is not ill-appearing.  HENT:     Head: Normocephalic and atraumatic.  Eyes:  Extraocular Movements: Extraocular movements intact.     Conjunctiva/sclera: Conjunctivae normal.     Pupils: Pupils are equal, round, and reactive to light.  Cardiovascular:     Rate and Rhythm: Normal rate.  Pulmonary:     Effort: Pulmonary effort is normal.  Skin:    General: Skin is warm and dry.  Neurological:     General: No focal deficit present.     Mental Status: She is alert and oriented to person, place, and time.  Psychiatric:        Mood and Affect: Mood normal.        Behavior: Behavior normal.      UC Treatments / Results  Labs (all labs ordered are listed, but only abnormal results are displayed) Labs Reviewed - No data to display  EKG   Radiology No results found.  Procedures Procedures (including critical care time)  Medications Ordered in UC Medications - No data to display  Initial Impression / Assessment and Plan / UC Course  I have reviewed the triage vital signs and the nursing notes.  Pertinent labs & imaging results that were available during my care of the patient  were reviewed by me and considered in my medical decision making (see chart for details).     I discussed with patient and the family that she needs to go to a mental health facility for further evaluation of her schizophrenia symptoms and possible medications that may be needed.  She is in agreement with plan and will go with family to the [facility] behavioral health center Final Clinical Impressions(s) / UC Diagnoses   Final diagnoses:  Schizophrenia, unspecified type [facility])     Discharge Instructions      Please go to our mental health facility for further treatment of your schizophrenia symptoms    ED Prescriptions   None    PDMP not reviewed this encounter.    [1]  Social History Tobacco Use   Smoking status: Former   Smokeless tobacco: Never  Substance Use Topics   Alcohol use: No   Drug use: No     Loreda Myla SAUNDERS, NP 07/21/24 1636  "

## 2024-07-21 NOTE — Discharge Instructions (Signed)

## 2024-07-21 NOTE — ED Triage Notes (Signed)
 Pt reports headaches and dizziness for about 2 days. Denies taking any medications for her headache.  Reports her blood pressure been high, denies taking prescriptions for HTN. Pt states that she has trouble sleeping at night.  Pt also states that her sugar was high earlier today 176, pt states that she had eaten and drank prior to checking her blood sugar.

## 2024-07-21 NOTE — Discharge Instructions (Addendum)
 Please go to our mental health facility for further treatment of your schizophrenia symptoms
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
## 2024-07-23 ENCOUNTER — Ambulatory Visit (INDEPENDENT_AMBULATORY_CARE_PROVIDER_SITE_OTHER)

## 2024-07-23 VITALS — BP 155/91 | HR 85 | Ht 63.0 in | Wt 146.0 lb

## 2024-07-23 DIAGNOSIS — F411 Generalized anxiety disorder: Secondary | ICD-10-CM | POA: Diagnosis not present

## 2024-07-23 DIAGNOSIS — F3132 Bipolar disorder, current episode depressed, moderate: Secondary | ICD-10-CM

## 2024-07-23 DIAGNOSIS — F312 Bipolar disorder, current episode manic severe with psychotic features: Secondary | ICD-10-CM | POA: Diagnosis not present

## 2024-07-23 DIAGNOSIS — Z79899 Other long term (current) drug therapy: Secondary | ICD-10-CM

## 2024-07-23 MED ORDER — RISPERIDONE 2 MG PO TABS: 2.0000 mg | ORAL_TABLET | Freq: Every day | ORAL | 1 refills | Status: AC

## 2024-07-23 MED ORDER — ESCITALOPRAM OXALATE 10 MG PO TABS: 10.0000 mg | ORAL_TABLET | Freq: Every day | ORAL | 1 refills | Status: AC

## 2024-07-23 NOTE — Progress Notes (Signed)
 " Psychiatric Initial Adult Assessment   Patient Identification: Teresa Kane MRN:  [id number] Date of Evaluation:  07/23/2024 Referral Source: [facility] Chief Complaint:  No chief complaint on file.  Visit Diagnosis:    ICD-10-CM   1. Bipolar affective disorder, currently depressed, moderate (HCC)  F31.32 escitalopram  (LEXAPRO ) 10 MG tablet    risperiDONE  (RISPERDAL ) 2 MG tablet    2. Bipolar affective disorder, current episode manic with psychotic symptoms (HCC)  F31.2     3. GAD (generalized anxiety disorder)  F41.1 escitalopram  (LEXAPRO ) 10 MG tablet    4. Long term current use of antipsychotic medication  Z79.899 HgB A1c    Lipid Profile    CBC w/Diff/Platelet    Comprehensive Metabolic Panel (CMET)    EKG 12-Lead       Assessment:  Teresa Kane is a 39 y.o. female with a history of bipolar 1 disorder, schizophrenia, catatonia, GAD who presents in person to [facility] for initial evaluation on 07/23/2024.    At initial evaluation patient reports decreased interest in daily activities, reduced energy, decreased concentration, sporadic sleep due to the psychosocial stressors that include interpersonal conflicts with her partner, work-related stress that has progressively affected her mood leading to symptoms of depression.  She has also been feeling anxious due to these concerns without any panic attacks.  She has been working full-time as a production assistant, radio at [employer] and has 2 kids that she takes care of.  Currently being uninsured is also playing a role in her presentation.  She has been eating well and sleep has improved after starting Risperdal  nightly.  She has not had any side effects apart from feeling dizzy in the morning, would be appropriate to decrease her dose to 2 mg nightly instead of twice daily.  Her mood has improved as well after starting Lexapro , appropriate to continue the same medication.  She is not using any substances including alcohol or  cigarettes.  She is not actively passively homicidal or suicidal and she has not been having any hallucinations.  Throughout the assessment she was calm and cooperative, alert and oriented and without any significant distress or RTIS.  She is a limited historian with fair insight into her condition and is very receptive to the treatment plan.  We ordered blood work today she insists her last blood work was about 2 years ago.  Discussed all her medications, updated her on the rest benefits and side effects.  Also recommended her to restart therapy at the outside clinic.  Will have her back in the clinic in 6 weeks.  A number of assessments were performed during the evaluation today including  PHQ-9 which they scored a 6 on, GAD-7 which they scored a 5 on, and [place] suicide severity screening which showed low risk.    Risk Assessment: A suicide and violence risk assessment was performed as part of this evaluation. There patient is deemed to be at chronic elevated risk for self-harm/suicide given the following factors: N/A. These risk factors are mitigated by the following factors: . The patient is deemed to be at chronic elevated risk for violence given the following factors: N/A. These risk factors are mitigated by the following factors: no known history of violence towards others, no known violence towards others in the last 6 months, no known history of threats of harm towards others, no known homicidal ideation in the last 6 months, no command hallucinations to harm others in the last 6 months, no active  symptoms of psychosis, and no active symptoms of mania. There is no acute risk for suicide or violence at this time. The patient was educated about relevant modifiable risk factors including following recommendations for treatment of psychiatric illness and abstaining from substance abuse.  While future psychiatric events cannot be accurately predicted, the patient does not currently require  acute  inpatient psychiatric care and does not currently meet [place]  involuntary commitment criteria.  Patient was given contact information for crisis resources, behavioral health clinic and was instructed to call 911 for emergencies.    Plan: # Bipolar affective disorder, currently depressed History of schizophrenia Past medication trials: Risperdal , olanzapine  Status of problem: Current Interventions: -- Continue Risperdal  2 mg nightly -- Continue seeing outpatient therapy  # Generalized anxiety disorder without panic attacks Past medication trials: Lexapro  Status of problem: Current Interventions: -- Continue Lexapro  10 mg in the morning -- Continue seeking outpatient therapy  # Long-term antipsychotic use Past medication trials:  Status of problem: Active Interventions: -- Blood work ordered today -- EKG ordered   History of Present Illness:   Teresa Kane is a 39 year old female with a significant history of bipolar 1 vs schizophrenia and a history of catatonia that presented to the clinic today to establish care.  She was earlier seen in the urgent care 3 days ago for insomnia and was started on Risperdal  2 mg twice daily and Lexapro  10 mg daily for mood. She was alert and oriented reported that she was previously on medications, could not remember the name and when asked about the diagnosis that she has she reported bipolar, schizophrenia.  She had difficulty remembering things reported that she has been getting stressed that has been affecting her mood due to my baby's father, work stress , she works at [employer] as a production assistant, radio.  Reported that she discontinued her meds after 2023 as she was feeling better and was recently at the urgent care 3 days ago with her sister due to I could not sleep . She stated that she was first seen by psychiatry a long time ago, had a history of bipolar in 2016, catatonia in 2023.  She endorsed feeling depressed with decreased interest in  daily activities, decreased energy, reduced concentration, reported good appetite and asked about sleep she stated earlier I was sleeping too much, now I am not sleeping .  Reported that she frequently wakes up at the middle of the night to void bladder and is able to go back to sleep but I do not sleep in deep sleep .but reported that after starting the medications day before yesterday she had a deep sleep.  She denied any active or passive SI/HI/AVH.  Reported that her mood earlier was not good but now it is better . She denied any history of mood swings, any history of paranoia.  I asked her about her distant history of bipolar, she was unable to describe. She also reported anxiety due to similar stressors as above but denied any panic attacks.  When asked about her mood she stated down.  She denied any history of trauma, denied any nightmares or flashbacks. We discussed about her medications she reported that she has been feeling lightheaded and dizziness in the morning after taking medications, denied any other side effects.  She denied any new physical concerns.  Discussed about taking Risperdal  2 mg nightly instead of twice daily and taking Lexapro  as prescribed.  Updated her with the risk benefits and side effects of her  medications.  She also reported that she was earlier in therapy and would like to resume therapy with them.  She denied using any substances including alcohol or cigarettes.  She has not done lab work recently, ordered blood tests and EKG today will have her back in the clinic in 6 weeks. Associated Signs/Symptoms: Depression Symptoms:  depressed mood, anhedonia, insomnia, fatigue, difficulty concentrating, hopelessness, anxiety, (Hypo) Manic Symptoms:  None Anxiety Symptoms:  Excessive Worry, Psychotic Symptoms:  none PTSD Symptoms: Negative  Past Psychiatric History:  Past psychiatric diagnoses: Schizophrenia unspecified, anxiety, catatonia, bipolar  disorder Psychiatric hospitalizations:Yes once in the past for catatonia Past suicide attempts: Denies Hx of self harm: Denies Hx of violence towards others: Denies Prior psychiatric providers: Dr. Marvis Prior therapy: Yes at [facility] Access to firearms: Denies  Prior medication trials: Lexapro , Risperdal , Ativan , olanzapine , Geodon , Ambien , asenapine   Substance use: None  Past Medical History:  Past Medical History:  Diagnosis Date   Anxiety    Bipolar affective disorder (HCC)    No past surgical history on file.  Family Psychiatric History: Sister has schizophrenia  Family History:  Family History  Problem Relation Age of Onset   Schizophrenia Mother    Schizophrenia Other     Social History:   Social History   Socioeconomic History   Marital status: Single    Spouse name: Not on file   Number of children: Not on file   Years of education: Not on file   Highest education level: Not on file  Occupational History   Not on file  Tobacco Use   Smoking status: Former   Smokeless tobacco: Never  Substance and Sexual Activity   Alcohol use: No   Drug use: No   Sexual activity: Yes  Other Topics Concern   Not on file  Social History Narrative   Not on file   Social Drivers of Health   Tobacco Use: Medium Risk (07/21/2024)   Patient History    Smoking Tobacco Use: Former    Smokeless Tobacco Use: Never    Passive Exposure: Not on file  Financial Resource Strain: Low Risk (07/22/2021)   Received from [facility]   Overall Financial Resource Strain (CARDIA)    Difficulty of Paying Living Expenses: Not very hard  Food Insecurity: No Food Insecurity (11/23/2021)   Received from [facility] visits prior to 09/15/2022.   Epic    Within the past 12 months, you worried that your food would run out before you got the money to buy more.: Never true    Within the past 12 months, the food you bought just didn't last and you didn't have  money to get more.: Never true  Transportation Needs: No Transportation Needs (07/22/2021)   Received from [facility]   PRAPARE - Transportation    Lack of Transportation (Medical): No    Lack of Transportation (Non-Medical): No  Physical Activity: Unknown (07/22/2021)   Received from [facility]   Exercise Vital Sign    Days of Exercise per Week: Not on file    On average, how many minutes do you engage in exercise at this level?: 20 min  Stress: Unknown (07/22/2021)   Received from [facility] - Occupational Stress Questionnaire    Feeling of Stress : Patient declined  Social Connections: Unknown (11/13/2021)   Received from [facility]   Social Network    Social Network: Not on file  Depression (  PHQ2-9): Medium Risk (07/23/2024)   Depression (PHQ2-9)    PHQ-2 Score: 6  Alcohol Screen: Not on file  Housing: Not on file  Utilities: Not on file  Health Literacy: Not on file    Additional Social History: Work as production assistant, radio at a hotel. Walking, cooking  Allergies:  Allergies[1]  Metabolic Disorder Labs: No results found for: HGBA1C, MPG No results found for: PROLACTIN No results found for: CHOL, TRIG, HDL, CHOLHDL, VLDL, LDLCALC Lab Results  Component Value Date   TSH 1.862 09/04/2007    Therapeutic Level Labs: No results found for: LITHIUM No results found for: CBMZ No results found for: VALPROATE  Current Medications: Current Outpatient Medications  Medication Sig Dispense Refill   escitalopram  (LEXAPRO ) 10 MG tablet Take 1 tablet (10 mg total) by mouth daily. 30 tablet 1   risperiDONE  (RISPERDAL ) 2 MG tablet Take 1 tablet (2 mg total) by mouth at bedtime. 30 tablet 1   No current facility-administered medications for this visit.    Musculoskeletal: Strength & Muscle Tone: within normal limits Gait & Station: normal Patient leans: N/A  Psychiatric Specialty Exam:  Psychiatric Specialty  Exam: Last menstrual period 07/01/2024.There is no height or weight on file to calculate BMI. Review of Systems  General Appearance: Casual  Eye Contact:  Good  Speech:  Clear and Coherent and Normal Rate  Volume:  Normal  Mood:  Euthymic  Affect:  Congruent  Thought Content: Logical   Suicidal Thoughts:  No  Homicidal Thoughts:  No  Thought Process:  Linear  Orientation:  Full (Time, Place, and Person)    Memory: Immediate;   Good Recent;   Good Remote;   Fair  Judgment:  Fair  Insight:  Fair  Concentration:  Concentration: Good and Attention Span: Good  Recall:  not formally assessed   Fund of Knowledge: Good  Language: Fair  Psychomotor Activity:  Normal  Akathisia:  No  AIMS (if indicated): not done  Assets:  Communication Skills Desire for Improvement Housing Physical Health Talents/Skills Transportation  ADL's:  Intact  Cognition: WNL  Sleep:  Fair    Screenings: GAD-7    Flowsheet Row Office Visit from 07/23/2024 in [facility]  Total GAD-7 Score 5   PHQ2-9    Flowsheet Row Office Visit from 07/23/2024 in [facility] ED from 07/21/2024 in [facility]  PHQ-2 Total Score 2 2  PHQ-9 Total Score 6 8   Flowsheet Row ED from 07/21/2024 in [facility] Most recent reading at 07/21/2024  5:27 PM UC from 07/21/2024 in [facility] [employer] [facility]) Most recent reading at 07/21/2024  4:11 PM  C-SSRS RISK CATEGORY No Risk No Risk     Collaboration of Care: Medication Management AEB Dr. Susen  Patient/Guardian was advised Release of Information must be obtained prior to any record release in order to collaborate their care with an outside provider. Patient/Guardian was advised if they have not already done so to contact the registration department to sign all necessary forms in order for us  to release information regarding their care.    Consent: Patient/Guardian gives verbal consent for treatment and assignment of benefits for services provided during this visit. Patient/Guardian expressed understanding and agreed to proceed.   Teresa Aldama, MD 1/8/20269:17 AM    [1]  Allergies Allergen Reactions   Aspirin Other (See Comments)    Patient stated that she was once told she was allergic,  but doesn't recall the reaction   "

## 2024-08-07 ENCOUNTER — Ambulatory Visit (HOSPITAL_COMMUNITY): Admitting: Physician Assistant

## 2024-09-01 ENCOUNTER — Encounter (HOSPITAL_COMMUNITY)
